# Patient Record
Sex: Female | Born: 1960 | Race: Black or African American | Hispanic: No | Marital: Married | Smoking: Never smoker
Health system: Southern US, Community
[De-identification: ages and names within clinical notes are randomized; demographics above are authoritative.]

## PROBLEM LIST (undated history)

## (undated) DIAGNOSIS — I10 Essential (primary) hypertension: Secondary | ICD-10-CM

## (undated) DIAGNOSIS — E7800 Pure hypercholesterolemia, unspecified: Secondary | ICD-10-CM

## (undated) HISTORY — PX: TUBAL LIGATION: SHX77

## (undated) HISTORY — PX: OTHER SURGICAL HISTORY: SHX169

## (undated) HISTORY — PX: SPINAL FUSION: SHX223

---

## 1998-05-25 ENCOUNTER — Inpatient Hospital Stay (HOSPITAL_COMMUNITY): Admission: AD | Admit: 1998-05-25 | Discharge: 1998-05-25 | Payer: Self-pay | Admitting: *Deleted

## 1998-05-27 ENCOUNTER — Inpatient Hospital Stay (HOSPITAL_COMMUNITY): Admission: AD | Admit: 1998-05-27 | Discharge: 1998-05-27 | Payer: Self-pay | Admitting: Obstetrics

## 1998-07-13 ENCOUNTER — Inpatient Hospital Stay (HOSPITAL_COMMUNITY): Admission: AD | Admit: 1998-07-13 | Discharge: 1998-07-13 | Payer: Self-pay | Admitting: Obstetrics & Gynecology

## 1998-09-26 ENCOUNTER — Ambulatory Visit (HOSPITAL_COMMUNITY): Admission: RE | Admit: 1998-09-26 | Discharge: 1998-09-26 | Payer: Self-pay

## 1998-10-04 ENCOUNTER — Inpatient Hospital Stay (HOSPITAL_COMMUNITY): Admission: AD | Admit: 1998-10-04 | Discharge: 1998-10-04 | Payer: Self-pay | Admitting: Obstetrics

## 1998-10-08 ENCOUNTER — Inpatient Hospital Stay (HOSPITAL_COMMUNITY): Admission: RE | Admit: 1998-10-08 | Discharge: 1998-10-08 | Payer: Self-pay | Admitting: Obstetrics & Gynecology

## 1998-10-15 ENCOUNTER — Encounter (HOSPITAL_COMMUNITY): Admission: RE | Admit: 1998-10-15 | Discharge: 1998-10-17 | Payer: Self-pay | Admitting: Obstetrics & Gynecology

## 1998-10-15 ENCOUNTER — Inpatient Hospital Stay (HOSPITAL_COMMUNITY): Admission: AD | Admit: 1998-10-15 | Discharge: 1998-10-23 | Payer: Self-pay | Admitting: *Deleted

## 1998-11-20 ENCOUNTER — Encounter: Admission: RE | Admit: 1998-11-20 | Discharge: 1998-11-20 | Payer: Self-pay | Admitting: Obstetrics & Gynecology

## 1998-11-20 ENCOUNTER — Other Ambulatory Visit: Admission: RE | Admit: 1998-11-20 | Discharge: 1998-11-20 | Payer: Self-pay | Admitting: Obstetrics & Gynecology

## 1998-12-03 ENCOUNTER — Ambulatory Visit (HOSPITAL_COMMUNITY): Admission: RE | Admit: 1998-12-03 | Discharge: 1998-12-03 | Payer: Self-pay | Admitting: Obstetrics & Gynecology

## 1998-12-28 ENCOUNTER — Encounter: Admission: RE | Admit: 1998-12-28 | Discharge: 1998-12-28 | Payer: Self-pay | Admitting: Obstetrics & Gynecology

## 2001-09-14 ENCOUNTER — Ambulatory Visit (HOSPITAL_COMMUNITY): Admission: RE | Admit: 2001-09-14 | Discharge: 2001-09-14 | Payer: Self-pay | Admitting: Obstetrics

## 2001-09-14 ENCOUNTER — Encounter: Payer: Self-pay | Admitting: Obstetrics

## 2001-10-29 ENCOUNTER — Emergency Department (HOSPITAL_COMMUNITY): Admission: EM | Admit: 2001-10-29 | Discharge: 2001-10-29 | Payer: Self-pay | Admitting: Emergency Medicine

## 2001-11-26 ENCOUNTER — Encounter: Payer: Self-pay | Admitting: Family Medicine

## 2001-11-26 ENCOUNTER — Encounter: Admission: RE | Admit: 2001-11-26 | Discharge: 2001-11-26 | Payer: Self-pay | Admitting: Family Medicine

## 2002-05-23 ENCOUNTER — Encounter: Admission: RE | Admit: 2002-05-23 | Discharge: 2002-05-23 | Payer: Self-pay | Admitting: Family Medicine

## 2002-05-23 ENCOUNTER — Encounter: Payer: Self-pay | Admitting: Family Medicine

## 2002-09-19 ENCOUNTER — Other Ambulatory Visit: Admission: RE | Admit: 2002-09-19 | Discharge: 2002-09-19 | Payer: Self-pay | Admitting: Family Medicine

## 2006-03-28 ENCOUNTER — Emergency Department (HOSPITAL_COMMUNITY): Admission: EM | Admit: 2006-03-28 | Discharge: 2006-03-28 | Payer: Self-pay | Admitting: Emergency Medicine

## 2006-11-19 ENCOUNTER — Emergency Department (HOSPITAL_COMMUNITY): Admission: EM | Admit: 2006-11-19 | Discharge: 2006-11-19 | Payer: Self-pay | Admitting: Emergency Medicine

## 2009-08-27 ENCOUNTER — Ambulatory Visit (HOSPITAL_COMMUNITY): Admission: RE | Admit: 2009-08-27 | Discharge: 2009-08-27 | Payer: Self-pay | Admitting: Family Medicine

## 2009-11-26 ENCOUNTER — Inpatient Hospital Stay (HOSPITAL_COMMUNITY): Admission: AD | Admit: 2009-11-26 | Discharge: 2009-11-26 | Payer: Self-pay | Admitting: Family Medicine

## 2009-11-26 ENCOUNTER — Emergency Department (HOSPITAL_COMMUNITY): Admission: EM | Admit: 2009-11-26 | Discharge: 2009-11-26 | Payer: Self-pay | Admitting: Emergency Medicine

## 2009-12-10 ENCOUNTER — Inpatient Hospital Stay (HOSPITAL_COMMUNITY): Admission: AD | Admit: 2009-12-10 | Discharge: 2009-12-10 | Payer: Self-pay | Admitting: Obstetrics & Gynecology

## 2009-12-12 ENCOUNTER — Other Ambulatory Visit: Admission: RE | Admit: 2009-12-12 | Discharge: 2009-12-12 | Payer: Self-pay | Admitting: Obstetrics and Gynecology

## 2009-12-12 ENCOUNTER — Ambulatory Visit: Payer: Self-pay | Admitting: Obstetrics and Gynecology

## 2009-12-13 ENCOUNTER — Encounter: Payer: Self-pay | Admitting: Family

## 2009-12-13 LAB — CONVERTED CEMR LAB
HCT: 29.2 % — ABNORMAL LOW (ref 36.0–46.0)
MCHC: 32.5 g/dL (ref 30.0–36.0)
MCV: 83 fL (ref 78.0–100.0)
Platelets: 294 10*3/uL (ref 150–400)
RBC: 3.52 M/uL — ABNORMAL LOW (ref 3.87–5.11)
WBC: 5.5 10*3/uL (ref 4.0–10.5)

## 2009-12-28 ENCOUNTER — Ambulatory Visit: Payer: Self-pay | Admitting: Obstetrics & Gynecology

## 2010-01-24 ENCOUNTER — Ambulatory Visit (HOSPITAL_COMMUNITY): Admission: RE | Admit: 2010-01-24 | Discharge: 2010-01-24 | Payer: Self-pay | Admitting: Obstetrics & Gynecology

## 2010-02-08 ENCOUNTER — Ambulatory Visit: Payer: Self-pay | Admitting: Obstetrics and Gynecology

## 2010-03-08 ENCOUNTER — Ambulatory Visit: Payer: Self-pay | Admitting: Obstetrics & Gynecology

## 2010-06-05 ENCOUNTER — Emergency Department (HOSPITAL_COMMUNITY): Admission: EM | Admit: 2010-06-05 | Discharge: 2010-06-05 | Payer: Self-pay | Admitting: Family Medicine

## 2010-06-19 ENCOUNTER — Ambulatory Visit: Payer: Self-pay | Admitting: Obstetrics and Gynecology

## 2010-06-19 LAB — CONVERTED CEMR LAB
HCT: 37.6 % (ref 36.0–46.0)
RDW: 15.7 % — ABNORMAL HIGH (ref 11.5–15.5)

## 2010-06-24 ENCOUNTER — Encounter: Admission: RE | Admit: 2010-06-24 | Discharge: 2010-06-24 | Payer: Self-pay | Admitting: Sports Medicine

## 2010-07-05 ENCOUNTER — Ambulatory Visit: Payer: Self-pay | Admitting: Obstetrics & Gynecology

## 2010-07-09 ENCOUNTER — Encounter: Admission: RE | Admit: 2010-07-09 | Discharge: 2010-07-09 | Payer: Self-pay | Admitting: Sports Medicine

## 2010-07-11 ENCOUNTER — Ambulatory Visit: Payer: Self-pay | Admitting: Nurse Practitioner

## 2010-07-11 DIAGNOSIS — E1169 Type 2 diabetes mellitus with other specified complication: Secondary | ICD-10-CM | POA: Insufficient documentation

## 2010-07-11 DIAGNOSIS — D259 Leiomyoma of uterus, unspecified: Secondary | ICD-10-CM | POA: Insufficient documentation

## 2010-07-11 DIAGNOSIS — M5137 Other intervertebral disc degeneration, lumbosacral region: Secondary | ICD-10-CM | POA: Insufficient documentation

## 2010-07-11 DIAGNOSIS — I10 Essential (primary) hypertension: Secondary | ICD-10-CM | POA: Insufficient documentation

## 2010-07-11 DIAGNOSIS — E669 Obesity, unspecified: Secondary | ICD-10-CM | POA: Insufficient documentation

## 2010-07-11 DIAGNOSIS — E7800 Pure hypercholesterolemia, unspecified: Secondary | ICD-10-CM | POA: Insufficient documentation

## 2010-07-16 ENCOUNTER — Ambulatory Visit: Payer: Self-pay | Admitting: Nurse Practitioner

## 2010-07-17 ENCOUNTER — Encounter (INDEPENDENT_AMBULATORY_CARE_PROVIDER_SITE_OTHER): Payer: Self-pay | Admitting: Nurse Practitioner

## 2010-07-17 LAB — CONVERTED CEMR LAB
Alkaline Phosphatase: 53 units/L (ref 39–117)
BUN: 15 mg/dL (ref 6–23)
Chloride: 101 meq/L (ref 96–112)
Creatinine, Ser: 0.9 mg/dL (ref 0.40–1.20)
Glucose, Bld: 73 mg/dL (ref 70–99)
Potassium: 3.9 meq/L (ref 3.5–5.3)
Total Protein: 7.7 g/dL (ref 6.0–8.3)

## 2010-07-22 ENCOUNTER — Ambulatory Visit (HOSPITAL_COMMUNITY): Admission: RE | Admit: 2010-07-22 | Discharge: 2010-07-22 | Payer: Self-pay | Admitting: Obstetrics & Gynecology

## 2010-07-22 ENCOUNTER — Inpatient Hospital Stay (HOSPITAL_COMMUNITY): Admission: AD | Admit: 2010-07-22 | Discharge: 2010-07-22 | Payer: Self-pay | Admitting: Obstetrics & Gynecology

## 2010-07-22 ENCOUNTER — Ambulatory Visit: Payer: Self-pay | Admitting: Obstetrics & Gynecology

## 2010-08-05 ENCOUNTER — Ambulatory Visit: Payer: Self-pay | Admitting: Obstetrics and Gynecology

## 2010-08-15 ENCOUNTER — Ambulatory Visit: Payer: Self-pay | Admitting: Nurse Practitioner

## 2010-08-15 ENCOUNTER — Encounter (INDEPENDENT_AMBULATORY_CARE_PROVIDER_SITE_OTHER): Payer: Self-pay | Admitting: Nurse Practitioner

## 2010-08-15 ENCOUNTER — Encounter: Payer: Self-pay | Admitting: Nurse Practitioner

## 2010-08-15 ENCOUNTER — Other Ambulatory Visit: Admission: RE | Admit: 2010-08-15 | Discharge: 2010-08-15 | Payer: Self-pay | Admitting: Nurse Practitioner

## 2010-08-15 DIAGNOSIS — N898 Other specified noninflammatory disorders of vagina: Secondary | ICD-10-CM | POA: Insufficient documentation

## 2010-08-15 LAB — CONVERTED CEMR LAB
Bilirubin Urine: NEGATIVE
Blood in Urine, dipstick: NEGATIVE
Glucose, Urine, Semiquant: NEGATIVE
KOH Prep: NEGATIVE
OCCULT 1: NEGATIVE
Urobilinogen, UA: 0.2
pH: 6.5

## 2010-08-16 ENCOUNTER — Encounter (INDEPENDENT_AMBULATORY_CARE_PROVIDER_SITE_OTHER): Payer: Self-pay | Admitting: Nurse Practitioner

## 2010-08-16 LAB — CONVERTED CEMR LAB
Basophils Absolute: 0 10*3/uL (ref 0.0–0.1)
Basophils Relative: 1 % (ref 0–1)
Cholesterol: 242 mg/dL — ABNORMAL HIGH (ref 0–200)
GC Probe Amp, Genital: NEGATIVE
Hemoglobin: 12.4 g/dL (ref 12.0–15.0)
LDL Cholesterol: 169 mg/dL — ABNORMAL HIGH (ref 0–99)
Lymphocytes Relative: 41 % (ref 12–46)
Lymphs Abs: 2.4 10*3/uL (ref 0.7–4.0)
MCHC: 33 g/dL (ref 30.0–36.0)
Neutro Abs: 2.8 10*3/uL (ref 1.7–7.7)
Neutrophils Relative %: 49 % (ref 43–77)
RDW: 16.3 % — ABNORMAL HIGH (ref 11.5–15.5)
Total CHOL/HDL Ratio: 4.2
WBC: 5.7 10*3/uL (ref 4.0–10.5)

## 2010-09-04 ENCOUNTER — Ambulatory Visit: Payer: Self-pay | Admitting: Obstetrics & Gynecology

## 2010-09-12 ENCOUNTER — Ambulatory Visit: Payer: Self-pay | Admitting: Obstetrics & Gynecology

## 2010-10-19 ENCOUNTER — Encounter: Payer: Self-pay | Admitting: Sports Medicine

## 2010-10-27 LAB — CONVERTED CEMR LAB
Cholesterol, target level: 200 mg/dL
HDL goal, serum: 40 mg/dL
Pap Smear: NEGATIVE

## 2010-10-29 NOTE — Progress Notes (Signed)
Summary: Office Visit/DEPRESSION SCREENING  Office Visit/DEPRESSION SCREENING   Imported By: Arta Bruce 08/15/2010 14:41:01  _____________________________________________________________________  External Attachment:    Type:   Image     Comment:   External Document

## 2010-10-29 NOTE — Letter (Signed)
Summary: *Referral Letter  [facility]  [address] [place], [place] [address]   Phone: [phone]  Fax: [phone]    07/16/2010  Thank you in advance for agreeing to see my patient:  [facility] [address] [place], [place]  [address]  Phone: [phone]   Patient seen today in office, blood pressure is controlled.  Patient is stable to proceed with planned procedure.   Current Medical Problems: 1)  NEED PROPHYLACTIC VACCINATION&INOCULATION FLU (ICD-V04.81) 2)  OBESITY (ICD-278.00) 3)  HYPERCHOLESTEROLEMIA (ICD-272.0) 4)  DEGENERATIVE DISC DISEASE, LUMBAR SPINE (ICD-722.52) 5)  FIBROIDS, UTERUS (ICD-218.9) 6)  HYPERTENSION, BENIGN ESSENTIAL (ICD-401.1)   Current Medications: 1)  VICODIN 5-500 MG TABS (HYDROCODONE-ACETAMINOPHEN) Rx by Dr. Farris Has 2)  FERROUS SULFATE 325 (65 FE) MG TBEC (FERROUS SULFATE) One tablet by mouth three times a day 3)  LISINOPRIL-HYDROCHLOROTHIAZIDE 10-12.5 MG TABS (LISINOPRIL-HYDROCHLOROTHIAZIDE) One tablet by mouth daily for blood pressure   Thank you again for agreeing to see our patient; please contact us if you have any further questions or need additional information.  Sincerely,  Lehman Prom, FNP

## 2010-10-29 NOTE — Assessment & Plan Note (Signed)
Summary: NEW - Establish Care   Vital Signs:  Patient profile:   50 year old female Height:      60 inches Weight:      186.3 pounds BMI:     36.52 Temp:     99.3 degrees F oral Pulse rate:   72 / minute Pulse rhythm:   regular Resp:     24 per minute BP sitting:   144 / 96  (right arm) Cuff size:   large  Vitals Entered By: Dutch Quint RN (July 11, 2010 3:08 PM)  Nutrition Counseling: Patient's BMI is greater than 25 and therefore counseled on weight management options. CC: Having fibroid surgery and needs evaluation/ release for surgery due to high BP, Hypertension Management, Lipid Management Pain Assessment Patient in pain? yes     Location: back Intensity: 7 Type: dull Onset of pain  Intermittent  Does patient need assistance? Functional Status Self care Ambulation Normal   CC:  Having fibroid surgery and needs evaluation/ release for surgery due to high BP, Hypertension Management, and Lipid Management.  History of Present Illness:  Pt into the office to establish care. Pt has been going to [facility] for the past 2-3 years.  Pt has surgery (ablation) scheduled on October 24th with preop on October 19th. Bleeding is continuous despite taking depoprovera for the past 6-9 months.  Known history of anemia and fibroids due to bleeding. 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  Hypertension History:      She complains of headache, but denies chest pain and palpitations.  Blood pressure has been elevated when going to [facility] hospital.        Positive major cardiovascular risk factors include hyperlipidemia and hypertension.  Negative major cardiovascular risk factors include female age less than 36 years old and non-tobacco-user status.    Lipid Management History:      Positive NCEP/ATP III risk factors include hypertension.  Negative NCEP/ATP III risk factors include female age less than 9  years old and non-tobacco-user status.        Comments include: previously on medications but pt has not taken in several months.      Habits & Providers  Alcohol-Tobacco-Diet     Alcohol drinks/day: 0     Tobacco Status: never  Exercise-Depression-Behavior     Does Patient Exercise: no     Drug Use: never  Past History:  Past Surgical History: Caesarean section x 2  Family History: mother - diabetes father - healthy  Social History: 4 children tobacco none ETOH - none Drug - noneSmoking Status:  never Drug Use:  never Does Patient Exercise:  no  Review of Systems General:  Complains of fatigue. CV:  Complains of chest pain or discomfort; midepigastric for the past 4 hours. Resp:  Denies cough. GI:  Denies abdominal pain, nausea, and vomiting. MS:  Complains of low back pain.  Physical Exam  General:  well-developed.  obese Head:  normocephalic.   Ears:  ear piercing(s) noted.   Lungs:  normal breath sounds.   Heart:  normal rate and regular rhythm.   Abdomen:  normal bowel sounds and no abdominal hernia.   Msk:  normal ROM.   Neurologic:  alert & oriented X3.     Impression & Recommendations:  Problem # 1:  HYPERTENSION, BENIGN ESSENTIAL (ICD-401.1) BP is elevated DASH diet EKG done - NSR Orders: EKG w/ Interpretation (93000) Her  updated medication list for this problem includes:    Lisinopril-hydrochlorothiazide 10-12.5 Mg Tabs (Lisinopril-hydrochlorothiazide) ..... One tablet by mouth daily for blood pressure  Problem # 2:  DEGENERATIVE DISC DISEASE, LUMBAR SPINE (ICD-722.52)  Pt to continue f/u with specialist currently getting injections from [facility] imaging  Problem # 3:  HYPERCHOLESTEROLEMIA (ICD-272.0)  will check lipids on next visit  Problem # 4:  FIBROIDS, UTERUS (ICD-218.9) keep appt with gyn for ablation  Problem # 5:  OBESITY (ICD-278.00) advised pt that  she is overwieght and to lose some weight would be beneficial for  several reasons  Problem # 6:  NEED PROPHYLACTIC VACCINATION&INOCULATION FLU (ICD-V04.81) given today in office  Complete Medication List: 1)  Vicodin 5-500 Mg Tabs (Hydrocodone-acetaminophen) .... Rx by dr. Farris Has 2)  Ferrous Sulfate 325 (65 Fe) Mg Tbec (Ferrous sulfate) .... One tablet by mouth three times a day 3)  Lisinopril-hydrochlorothiazide 10-12.5 Mg Tabs (Lisinopril-hydrochlorothiazide) .... One tablet by mouth daily for blood pressure  Other Orders: Flu Vaccine 6yrs + (82993) Admin 1st Vaccine (71696) Admin 1st Vaccine [facility]) [phone])  Hypertension Assessment/Plan:      The patient's hypertensive risk group is category B: At least one risk factor (excluding diabetes) with no target organ damage.  Today's blood pressure is 144/96.  Her blood pressure goal is < 140/90.  Lipid Assessment/Plan:      Based on NCEP/ATP III, the patient's risk factor category is "0-1 risk factors".  The patient's lipid goals are as follows: Total cholesterol goal is 200; LDL cholesterol goal is 160; HDL cholesterol goal is 40; Triglyceride goal is 150.    Patient Instructions: 1)  Follow up in 5 days for triage visit - blood pressure check and cmp. 2)  Goal is <130/90 3)  Take your blood pressure medications before this appointment 4)  Keep your appointment for preop and surgery as scheduled. 5)  Follow up in 4 weeks with n.martin,fnp for a complete physical exam.  no food after midnight before this visit.  Will need lipids, tsh, cbc, u/a, microalbumin, tdap, PHQ-9  6)  You have been given the flu vaccine today Prescriptions: LISINOPRIL-HYDROCHLOROTHIAZIDE 10-12.5 MG TABS (LISINOPRIL-HYDROCHLOROTHIAZIDE) One tablet by mouth daily for blood pressure  #30 x 1   Entered and Authorized by:   Lehman Prom FNP   Signed by:   Lehman Prom FNP on 07/11/2010   Method used:   Print then Give to Patient   RxID:   [id number]    EKG  Procedure date:  07/11/2010  Findings:       NSR     Influenza Vaccine    Vaccine Type: Fluvax 3+    Site: left deltoid    Mfr: GlaxoSmithKline    Dose: 0.5 ml    Route: IM    Given by: [place] Shannon    Exp. Date: 02/2011    Lot #: [id number]    VIS given: 04/23/10 version given July 11, 2010.  Flu Vaccine Consent Questions    Do you have a history of severe allergic reactions to this vaccine? no    Any prior history of allergic reactions to egg and/or gelatin? no    Do you have a sensitivity to the preservative Thimersol? no    Do you have a past history of Guillan-Barre Syndrome? no    Do you currently have an acute febrile illness? no    Have you ever had a severe reaction to latex? no    Vaccine information given and explained to  patient? yes    Are you currently pregnant? no

## 2010-10-29 NOTE — Assessment & Plan Note (Signed)
Summary: 5 DAY FU ON BP AND CMP///KT  Nurse Visit   Vital Signs:  Patient profile:   50 year old female Pulse rate:   92 / minute Pulse rhythm:   regular Resp:     20 per minute BP sitting:   108 / 82  (left arm) Cuff size:   large  Vitals Entered By: Dutch Quint RN (July 16, 2010 11:10 AM)  Patient Instructions: 1)  Reviewed with Jesse Fall 2)  Blood pressure is good 3)  Keep next scheduled appointment for follow-up 4)  Continue taking medications as ordered. 5)  You have been cleared for your surgical procedure. 6)  Call if anything changes or if you have any questions.   CC:  BP and CMP.  History of Present Illness: Took all her medications today.  Asymptomatic.  States has had some leg cramping the last couple of days, urinating a little bit more when she's laying down in bed.   Review of Systems CV:  Complains of fatigue; denies bluish discoloration of lips or nails, chest pain or discomfort, difficulty breathing at night, difficulty breathing while lying down, fainting, leg cramps with exertion, lightheadness, near fainting, palpitations, shortness of breath with exertion, swelling of feet, swelling of hands, and weight gain.   Physical Exam  Lungs:  normal respiratory effort, normal breath sounds, no crackles, and no wheezes.   Heart:  normal rate and regular rhythm.     Impression & Recommendations:  Problem # 1:  HYPERTENSION, BENIGN ESSENTIAL (ICD-401.1)  BP is good -- 108/82 She is stable -- cleared for surgical procedure; letter written CMP done Keep next scheduled appt. for f/u Continue meds as ordered  Her updated medication list for this problem includes:    Lisinopril-hydrochlorothiazide 10-12.5 Mg Tabs (Lisinopril-hydrochlorothiazide) ..... One tablet by mouth daily for blood pressure  Orders: T-Comprehensive Metabolic Panel ([address])  Complete Medication List: 1)  Vicodin 5-500 Mg Tabs (Hydrocodone-acetaminophen) .... Rx by dr.  Farris Has 2)  Ferrous Sulfate 325 (65 Fe) Mg Tbec (Ferrous sulfate) .... One tablet by mouth three times a day 3)  Lisinopril-hydrochlorothiazide 10-12.5 Mg Tabs (Lisinopril-hydrochlorothiazide) .... One tablet by mouth daily for blood pressure  CC: BP and CMP Is Patient Diabetic? No Pain Assessment      Location: legs   Orders Added: 1)  Est. Patient Level I [09811] 2)  T-Comprehensive Metabolic Panel [[id number]]

## 2010-10-29 NOTE — Letter (Signed)
Summary: TEST ORDER FORM//MAMMOGRAM//APPT DATE & TIME  TEST ORDER FORM//MAMMOGRAM//APPT DATE & TIME   Imported By: Arta Bruce 08/16/2010 15:28:01  _____________________________________________________________________  External Attachment:    Type:   Image     Comment:   External Document

## 2010-10-29 NOTE — Assessment & Plan Note (Signed)
Summary: Complete Physical Exam   Vital Signs:  Patient profile:   50 year old female LMP:     05/2010 Weight:      189.6 pounds BMI:     37.16 Temp:     99.8 degrees F oral Pulse rate:   84 / minute Pulse rhythm:   regular Resp:     20 per minute BP sitting:   100 / 68  (left arm) Cuff size:   large  Vitals Entered By: Levon Hedger (August 15, 2010 10:55 AM)  Nutrition Counseling: Patient's BMI is greater than 25 and therefore counseled on weight management options.                                                                                                                                                                                                                     CC: CPP, Hypertension Management, Lipid Management Is Patient Diabetic? No Pain Assessment Patient in pain? no       Does patient need assistance? Functional Status Self care Ambulation Normal LMP (date): 05/2010     Enter LMP: 05/2010   CC:  CPP, Hypertension Management, and Lipid Management.  History of Present Illness:  Pt into the office for a complete physical exam  PAP - last done 2 years ago. All previous normal results Menses - heavy periods up until 3 weeks ago when she had the ablation Abalation done at [facility] 3 weeks ago for fibroids 4 children married   Mammogram - last done 1 year ago no family history of breast cancer  Optho - wear glasses.  Last eye exam was 6 months and new Rx for glasses given then  Dental - last done 3 months ago.   Tdap - no tetanus in past 10 years.  Hypertension History:      She denies headache, chest pain, and palpitations.  She notes no problems with any antihypertensive medication side effects.  pt is taking medications as ordered.        Positive major cardiovascular risk factors include hyperlipidemia and hypertension.  Negative major cardiovascular risk factors include female age less than 51 years old, no history of diabetes, and  non-tobacco-user status.        Further assessment for target organ damage reveals no history of ASHD, cardiac end-organ damage (CHF/LVH), stroke/TIA, peripheral vascular disease, renal insufficiency, or hypertensive retinopathy.    Lipid Management History:      Positive NCEP/ATP III risk factors include hypertension.  Negative  NCEP/ATP III risk factors include female age less than 66 years old, no history of early menopause without estrogen hormone replacement, non-diabetic, non-tobacco-user status, no ASHD (atherosclerotic heart disease), no prior stroke/TIA, no peripheral vascular disease, and no history of aortic aneurysm.        The patient states that she knows about the "Therapeutic Lifestyle Change" diet.  Adjunctive measures started by the patient include weight reduction.  She expresses no side effects from her lipid-lowering medication.  The patient denies any symptoms to suggest myopathy or liver disease.      Habits & Providers  Alcohol-Tobacco-Diet     Alcohol drinks/day: 0     Tobacco Status: never  Exercise-Depression-Behavior     Does Patient Exercise: no     Have you felt down or hopeless? no     Have you felt little pleasure in things? no     Depression Counseling: not indicated; screening negative for depression     Drug Use: never  Comments: PHQ- 9 score = 4  Allergies (verified): No Known Drug Allergies  Review of Systems General:  Denies fever. Eyes:  Denies discharge. ENT:  Denies earache. CV:  Denies chest pain or discomfort. Resp:  Denies cough. GI:  Complains of abdominal pain and constipation; denies nausea and vomiting; due to iron, slight right lower abd tenderness following surgery. GU:  Denies discharge. MS:  Denies joint pain. Derm:  Denies dryness. Neuro:  Denies headaches. Psych:  Denies anxiety and depression.  Physical Exam  General:  alert.   Head:  normocephalic.   Eyes:  pupils equal and pupils round.   Ears:  R ear normal and L  ear normal.  bil TM with bony landmarks present Nose:  no nasal discharge.   Mouth:  fair dentition.   Neck:  supple.   Chest Wall:  no mass.   Breasts:  no masses.   Lungs:  normal breath sounds.   Heart:  normal rate and regular rhythm.   Abdomen:  normal bowel sounds.   Rectal:  no external abnormalities.   Pulses:  R radial normal and L radial normal.   Extremities:  no edema Neurologic:  alert & oriented X3.   Skin:  color normal.   Psych:  Oriented X3.    Pelvic Exam  Vulva:      normal appearance.   Urethra and Bladder:      Urethra--normal.  Bladder--normal.   Vagina:      physiologic discharge.   Cervix:      parous.   Uterus:      anteverted.   Adnexa:      nontender bilaterally.   Rectum:      normal, heme negative stool.      Impression & Recommendations:  Problem # 1:  HYPERTENSION, BENIGN ESSENTIAL (ICD-401.1) BP is stable  DASH diet continue current meds tdap given Her updated medication list for this problem includes:    Lisinopril-hydrochlorothiazide 10-12.5 Mg Tabs (Lisinopril-hydrochlorothiazide) ..... One tablet by mouth daily for blood pressure  Orders: UA Dipstick w/o Micro (manual) ([address]109) T-Urine Microalbumin w/creat. ratio [phone]) EKG w/ Interpretation (93000)  Problem # 2:  HYPERCHOLESTEROLEMIA (ICD-272.0) will check labs today Orders: T-Lipid Profile (0011001100)  Problem # 3:  OBESITY (ICD-278.00) advised pt to increase exercise however given recent procedure she has not been able to exercise for the past 3 weeks Orders: T-TSH ([id number])  Problem # 4:  OTHER SCREENING BREAST EXAMINATION (ICD-V76.19) self breast exam given mammogram scheduled  Problem #  5:  ROUTINE GYNECOLOGICAL EXAMINATION (ICD-V72.31) PAP done pt requested STD testing PHQ-9 score = 4 Orders: Hemoccult Guaiac-1 spec.(in office) (82270) Rapid HIV  (92370) KOH/ WET Mount [phone]) Pap Smear, Thin Prep ( Collection of) (U0454) Mammogram  (Screening) (Mammo)  Problem # 6:  VAGINAL DISCHARGE (ICD-623.5) will check for bacterial infection Orders: T- GC Chlamydia (09811)  Complete Medication List: 1)  Vicodin 5-500 Mg Tabs (Hydrocodone-acetaminophen) .... Rx by dr. Farris Has 2)  Ferrous Sulfate 325 (65 Fe) Mg Tbec (Ferrous sulfate) .... One tablet by mouth three times a day 3)  Lisinopril-hydrochlorothiazide 10-12.5 Mg Tabs (Lisinopril-hydrochlorothiazide) .... One tablet by mouth daily for blood pressure  Other Orders: T-CBC w/Diff ([id number]) Tdap => 68yrs IM (13086) Admin 1st Vaccine (57846)  Hypertension Assessment/Plan:      The patient's hypertensive risk group is category B: At least one risk factor (excluding diabetes) with no target organ damage.  Today's blood pressure is 100/68.  Her blood pressure goal is < 140/90.  Lipid Assessment/Plan:      Based on NCEP/ATP III, the patient's risk factor category is "0-1 risk factors".  The patient's lipid goals are as follows: Total cholesterol goal is 200; LDL cholesterol goal is 160; HDL cholesterol goal is 40; Triglyceride goal is 150.    Patient Instructions: 1)  You have been given a tetanus today.  This is good for 10 years. 2)  Constipation - most likely from taking iron pills.  Be sure to eat high fiber foods. 3)  May also take a stool softner (colace) 100mg  by mouth two times a day  4)  You will be informed of your lab results. 5)  Blood pressure - doing well. Continue medications. 6)  Keep your appointment for mammogram 7)  Follow up here in 6 months or sooner if necessary  Diabetic Foot Exam Foot Inspection Is there a history of a foot ulcer?              No Is there a foot ulcer now?              No Can the patient see the bottom of their feet?          No Are the shoes appropriate in style and fit?          Yes Is there swelling or an abnormal foot shape?          No Are the toenails long?                Yes Are the toenails thick?                 Yes Are the toenails ingrown?              No Is there heavy callous build-up?              No Is there pain in the calf muscle (Intermittent claudication) when walking?    NoIs there a claw toe deformity?              No Is there elevated skin temperature?            No Is there limited ankle dorsiflexion?            No Is there foot or ankle muscle weakness?            No  Diabetic Foot Care Education Patient educated on appropriate care of diabetic feet.  Pulse Check  Right Foot          Left Foot Dorsalis Pedis:        normal            normal  Prescriptions: LISINOPRIL-HYDROCHLOROTHIAZIDE 10-12.5 MG TABS (LISINOPRIL-HYDROCHLOROTHIAZIDE) One tablet by mouth daily for blood pressure  #30 x 5   Entered and Authorized by:   Lehman Prom FNP   Signed by:   Lehman Prom FNP on 08/15/2010   Method used:   Print then Give to Patient   RxID:   [id number]    Orders Added: 1)  Est. Patient age 41-64 [99396] 2)  Hemoccult Guaiac-1 spec.(in office) [82270] 3)  UA Dipstick w/o Micro (manual) [81002] 4)  T-Urine Microalbumin w/creat. ratio [82043-[address]] 5)  T-Lipid Profile [80061-22930] 6)  T-CBC w/Diff [84132-44010] 7)  Rapid HIV  [92370] 8)  T-TSH [27253-66440] 9)  KOH/ WET Mount [87210] 10)  Pap Smear, Thin Prep ( Collection of) [Q0091] 11)  EKG w/ Interpretation [93000] 12)  Mammogram (Screening) [Mammo] 13)  T- GC Chlamydia [34742] 14)  Tdap => 16yrs IM [90715] 15)  Admin 1st Vaccine [59563]   Immunizations Administered:  Tetanus Vaccine:    Vaccine Type: Tdap    Site: left deltoid    Mfr: Sanofi Pasteur    Dose: 0.5 ml    Route: IM    Given by: Levon Hedger    Exp. Date: 12/26/2012    Lot #: [id number]    VIS given: 08/16/08 version given August 15, 2010.   Immunizations Administered:  Tetanus Vaccine:    Vaccine Type: Tdap    Site: left deltoid    Mfr: Sanofi Pasteur    Dose: 0.5 ml    Route: IM    Given by: Levon Hedger    Exp.  Date: 12/26/2012    Lot #: [id number]    VIS given: 08/16/08 version given August 15, 2010.   EKG  Procedure date:  08/08/2010  Findings:      sinus rhythm with PVC's   Laboratory Results   Urine Tests  Date/Time Received: August 15, 2010 12:10 PM   Routine Urinalysis   Color: hazy Glucose: negative   (Normal Range: Negative) Bilirubin: negative   (Normal Range: Negative) Ketone: negative   (Normal Range: Negative) Spec. Gravity: 1.015   (Normal Range: 1.003-1.035) Blood: negative   (Normal Range: Negative) pH: 6.5   (Normal Range: 5.0-8.0) Protein: negative   (Normal Range: Negative) Urobilinogen: 0.2   (Normal Range: 0-1) Nitrite: negative   (Normal Range: Negative) Leukocyte Esterace: negative   (Normal Range: Negative)    Date/Time Received: August 15, 2010 3:23 PM   Wet Mount Source: vaginal WBC/hpf: 1-5 Bacteria/hpf: rare Clue cells/hpf: none Yeast/hpf: none Wet Mount KOH: Negative Trichomonas/hpf: none  Other Tests  Rapid HIV: negative  Stool - Occult Blood Hemmoccult #1: negative Date: 08/15/2010     Prevention & Chronic Care Immunizations   Influenza vaccine: Fluvax 3+  (07/11/2010)    Tetanus booster: 08/15/2010: Tdap    Pneumococcal vaccine: Not documented  Other Screening   Pap smear: Not documented    Mammogram: Not documented   Smoking status: never  (08/15/2010)  Lipids   Total Cholesterol: Not documented   LDL: Not documented   LDL Direct: Not documented   HDL: Not documented   Triglycerides: Not documented    SGOT (AST): 13  (07/17/2010)   SGPT (ALT): 13  (07/17/2010)   Alkaline phosphatase: 53  (07/17/2010)   Total bilirubin: 0.5  (07/17/2010)  Hypertension   Last Blood Pressure: 100 / 68  (08/15/2010)   Serum creatinine: 0.90  (07/17/2010)   Serum potassium 3.9  (07/17/2010)  Self-Management Support :    Hypertension self-management support: Not documented    Lipid self-management support: Not  documented

## 2010-10-29 NOTE — Letter (Signed)
Summary: *HSN Results Follow up  [facility]  [address] [place], [place] [address]   Phone: [phone]  Fax: [phone]      07/17/2010   Mckala D SCUDDER-FRANCIS [address] [place], [place]  [address]   Dear  Ms. Kealohilani SCUDDER-FRANCIS,                            ____S.Drinkard,FNP   ____D. Gore,FNP       ____B. McPherson,MD   ____V. Rankins,MD    ____E. Mulberry,MD    _X___N. Daphine Deutscher, FNP  ____D. Reche Dixon, MD    ____K. Philipp Deputy, MD    ____Other     This letter is to inform you that your recent test(s):  _______Pap Smear    ___X____Lab Test     _______X-ray    ___X____ is within acceptable limits  _______ requires a medication change  _______ requires a follow-up lab visit  _______ requires a follow-up visit with your Maryse Brierley   Comments: Labs done during recent office visit are normal.       _________________________________________________________ If you have any questions, please contact our office [phone].                    Sincerely,    Lehman Prom FNP [facility]

## 2010-10-29 NOTE — Letter (Signed)
Summary: Lipid Letter  [facility]  [address] [place], [place] [address]   Phone: [phone]  Fax: [phone]    08/16/2010  [facility] [address] [place], [place]  [address]  Dear Donnal Debar:  We have carefully reviewed your last lipid profile from 08/15/2010 and the results are noted below with a summary of recommendations for lipid management.    Cholesterol:       242     Goal: less than 200   HDL "good" Cholesterol:   58     Goal: greater than 40   LDL "bad" Cholesterol:   169     Goal: less than 100   Triglycerides:       73     Goal: less than 150    Your cholesterol is elevated.  You should have been notified by this office on the need to start cholesterol medications.  You will need recheck cholesterol and liver labs in 6-8 weeks after starting medications.  Pap Smear results _______________________.     Current Medications: 1)    Vicodin 5-500 Mg Tabs (Hydrocodone-acetaminophen) .... Rx by dr. Farris Has 2)    Ferrous Sulfate 325 (65 Fe) Mg Tbec (Ferrous sulfate) .... One tablet by mouth three times a day 3)    Lisinopril-hydrochlorothiazide 10-12.5 Mg Tabs (Lisinopril-hydrochlorothiazide) .... One tablet by mouth daily for blood pressure 4)    Pravastatin Sodium 20 Mg Tabs (Pravastatin sodium) .... One tablet by mouth nightly for cholesterol  If you have any questions, please call. We appreciate being able to work with you.   Sincerely,    [facility] Lehman Prom FNP

## 2010-11-28 ENCOUNTER — Other Ambulatory Visit: Payer: Self-pay | Admitting: Sports Medicine

## 2010-11-28 DIAGNOSIS — M549 Dorsalgia, unspecified: Secondary | ICD-10-CM

## 2010-12-02 ENCOUNTER — Ambulatory Visit
Admission: RE | Admit: 2010-12-02 | Discharge: 2010-12-02 | Disposition: A | Discharge status: Home / Self Care | Payer: No Typology Code available for payment source | Source: Ambulatory Visit | Attending: Sports Medicine | Admitting: Sports Medicine

## 2010-12-02 DIAGNOSIS — M549 Dorsalgia, unspecified: Secondary | ICD-10-CM

## 2010-12-11 LAB — BASIC METABOLIC PANEL
CO2: 27 mEq/L (ref 19–32)
Calcium: 9.2 mg/dL (ref 8.4–10.5)
Chloride: 101 mEq/L (ref 96–112)
Creatinine, Ser: 0.88 mg/dL (ref 0.4–1.2)
GFR calc non Af Amer: >60 mL/min (ref >60)
Potassium: 3.5 mEq/L (ref 3.5–5.1)
Sodium: 135 mEq/L (ref 135–145)

## 2010-12-11 LAB — CBC
HCT: 39.7 % (ref 36.0–46.0)
MCH: 29.4 pg (ref 26.0–34.0)
MCHC: 33.2 g/dL (ref 30.0–36.0)
MCV: 88.6 fL (ref 78.0–100.0)
RBC: 4.48 MIL/uL (ref 3.87–5.11)

## 2010-12-11 LAB — PREGNANCY, URINE: Preg Test, Ur: NEGATIVE

## 2010-12-12 LAB — POCT PREGNANCY, URINE: Preg Test, Ur: NEGATIVE

## 2010-12-18 LAB — POCT I-STAT, CHEM 8
BUN: 4 mg/dL — ABNORMAL LOW (ref 6–23)
Calcium, Ion: 1.14 mmol/L (ref 1.12–1.32)
Creatinine, Ser: 0.7 mg/dL (ref 0.4–1.2)
Glucose, Bld: 98 mg/dL (ref 70–99)
TCO2: 28 mmol/L (ref 0–100)

## 2010-12-18 LAB — TYPE AND SCREEN

## 2010-12-18 LAB — URINALYSIS, ROUTINE W REFLEX MICROSCOPIC
Leukocytes, UA: NEGATIVE
Specific Gravity, Urine: 1.008 (ref 1.005–1.030)
Urobilinogen, UA: 0.2 mg/dL (ref 0.0–1.0)
pH: 7.5 (ref 5.0–8.0)

## 2010-12-18 LAB — ABO/RH: ABO/RH(D): O NEG

## 2010-12-18 LAB — WET PREP, GENITAL
Clue Cells Wet Prep HPF POC: NONE SEEN
Trich, Wet Prep: NONE SEEN
WBC, Wet Prep HPF POC: NONE SEEN
Yeast Wet Prep HPF POC: NONE SEEN

## 2010-12-18 LAB — GC/CHLAMYDIA PROBE AMP, GENITAL: GC Probe Amp, Genital: NEGATIVE

## 2010-12-19 LAB — CBC
HCT: 31.7 % — ABNORMAL LOW (ref 36.0–46.0)
Hemoglobin: 10.7 g/dL — ABNORMAL LOW (ref 12.0–15.0)
MCHC: 33.7 g/dL (ref 30.0–36.0)
MCV: 85.5 fL (ref 78.0–100.0)
RBC: 3.71 MIL/uL — ABNORMAL LOW (ref 3.87–5.11)
RDW: 18 % — ABNORMAL HIGH (ref 11.5–15.5)

## 2010-12-23 LAB — POCT PREGNANCY, URINE: Preg Test, Ur: NEGATIVE

## 2010-12-23 LAB — CBC
Hemoglobin: 10.1 g/dL — ABNORMAL LOW (ref 12.0–15.0)
MCHC: 32.7 g/dL (ref 30.0–36.0)
RBC: 3.58 MIL/uL — ABNORMAL LOW (ref 3.87–5.11)
RDW: 18.3 % — ABNORMAL HIGH (ref 11.5–15.5)
WBC: 3.9 10*3/uL — ABNORMAL LOW (ref 4.0–10.5)

## 2011-01-31 ENCOUNTER — Emergency Department (HOSPITAL_COMMUNITY): Payer: No Typology Code available for payment source

## 2011-01-31 ENCOUNTER — Emergency Department (HOSPITAL_COMMUNITY)
Admission: EM | Admit: 2011-01-31 | Discharge: 2011-01-31 | Disposition: A | Discharge status: Home / Self Care | Payer: No Typology Code available for payment source | Attending: Emergency Medicine | Admitting: Emergency Medicine

## 2011-01-31 DIAGNOSIS — S40019A Contusion of unspecified shoulder, initial encounter: Secondary | ICD-10-CM | POA: Insufficient documentation

## 2011-01-31 DIAGNOSIS — E7800 Pure hypercholesterolemia, unspecified: Secondary | ICD-10-CM | POA: Insufficient documentation

## 2011-01-31 DIAGNOSIS — M542 Cervicalgia: Secondary | ICD-10-CM | POA: Insufficient documentation

## 2011-01-31 DIAGNOSIS — I517 Cardiomegaly: Secondary | ICD-10-CM | POA: Insufficient documentation

## 2011-01-31 DIAGNOSIS — S335XXA Sprain of ligaments of lumbar spine, initial encounter: Secondary | ICD-10-CM | POA: Insufficient documentation

## 2011-01-31 DIAGNOSIS — J984 Other disorders of lung: Secondary | ICD-10-CM | POA: Insufficient documentation

## 2011-01-31 DIAGNOSIS — S20219A Contusion of unspecified front wall of thorax, initial encounter: Secondary | ICD-10-CM | POA: Insufficient documentation

## 2011-01-31 DIAGNOSIS — I10 Essential (primary) hypertension: Secondary | ICD-10-CM | POA: Insufficient documentation

## 2011-01-31 DIAGNOSIS — S139XXA Sprain of joints and ligaments of unspecified parts of neck, initial encounter: Secondary | ICD-10-CM | POA: Insufficient documentation

## 2011-01-31 LAB — POCT I-STAT, CHEM 8
Creatinine, Ser: 1 mg/dL (ref 0.4–1.2)
Hemoglobin: 12.6 g/dL (ref 12.0–15.0)
Potassium: 3.5 mEq/L (ref 3.5–5.1)
Sodium: 140 mEq/L (ref 135–145)

## 2011-02-05 ENCOUNTER — Ambulatory Visit: Payer: No Typology Code available for payment source | Attending: Sports Medicine | Admitting: Rehabilitation

## 2011-02-05 DIAGNOSIS — IMO0001 Reserved for inherently not codable concepts without codable children: Secondary | ICD-10-CM | POA: Insufficient documentation

## 2011-02-05 DIAGNOSIS — M542 Cervicalgia: Secondary | ICD-10-CM | POA: Insufficient documentation

## 2011-02-05 DIAGNOSIS — M545 Low back pain, unspecified: Secondary | ICD-10-CM | POA: Insufficient documentation

## 2011-02-05 DIAGNOSIS — R262 Difficulty in walking, not elsewhere classified: Secondary | ICD-10-CM | POA: Insufficient documentation

## 2011-02-11 ENCOUNTER — Ambulatory Visit: Payer: No Typology Code available for payment source | Admitting: Physical Therapy

## 2011-02-13 ENCOUNTER — Ambulatory Visit: Payer: No Typology Code available for payment source | Admitting: Physical Therapy

## 2011-02-18 ENCOUNTER — Ambulatory Visit: Payer: No Typology Code available for payment source | Admitting: Physical Therapy

## 2011-02-20 ENCOUNTER — Ambulatory Visit: Payer: No Typology Code available for payment source | Admitting: Physical Therapy

## 2011-02-26 ENCOUNTER — Ambulatory Visit: Payer: No Typology Code available for payment source | Admitting: Physical Therapy

## 2011-02-27 ENCOUNTER — Ambulatory Visit: Payer: No Typology Code available for payment source | Admitting: Physical Therapy

## 2011-03-04 ENCOUNTER — Ambulatory Visit: Payer: No Typology Code available for payment source | Attending: Sports Medicine | Admitting: Physical Therapy

## 2011-03-04 DIAGNOSIS — M542 Cervicalgia: Secondary | ICD-10-CM | POA: Insufficient documentation

## 2011-03-04 DIAGNOSIS — M545 Low back pain, unspecified: Secondary | ICD-10-CM | POA: Insufficient documentation

## 2011-03-04 DIAGNOSIS — IMO0001 Reserved for inherently not codable concepts without codable children: Secondary | ICD-10-CM | POA: Insufficient documentation

## 2011-03-04 DIAGNOSIS — R262 Difficulty in walking, not elsewhere classified: Secondary | ICD-10-CM | POA: Insufficient documentation

## 2011-03-06 ENCOUNTER — Ambulatory Visit: Payer: No Typology Code available for payment source | Admitting: Physical Therapy

## 2011-03-11 ENCOUNTER — Ambulatory Visit: Payer: No Typology Code available for payment source | Admitting: Physical Therapy

## 2011-03-13 ENCOUNTER — Encounter: Payer: No Typology Code available for payment source | Admitting: Physical Therapy

## 2011-03-14 ENCOUNTER — Ambulatory Visit: Payer: No Typology Code available for payment source | Admitting: Physical Therapy

## 2011-03-19 ENCOUNTER — Ambulatory Visit: Payer: No Typology Code available for payment source | Admitting: Physical Therapy

## 2011-03-21 ENCOUNTER — Ambulatory Visit: Payer: No Typology Code available for payment source | Admitting: Physical Therapy

## 2011-03-26 ENCOUNTER — Ambulatory Visit: Payer: No Typology Code available for payment source | Admitting: Physical Therapy

## 2011-03-27 ENCOUNTER — Inpatient Hospital Stay (INDEPENDENT_AMBULATORY_CARE_PROVIDER_SITE_OTHER)
Admission: RE | Admit: 2011-03-27 | Discharge: 2011-03-27 | Disposition: A | Discharge status: Home / Self Care | Payer: No Typology Code available for payment source | Source: Ambulatory Visit | Attending: Family Medicine | Admitting: Family Medicine

## 2011-03-27 DIAGNOSIS — K219 Gastro-esophageal reflux disease without esophagitis: Secondary | ICD-10-CM

## 2011-03-28 ENCOUNTER — Ambulatory Visit: Payer: No Typology Code available for payment source | Admitting: Physical Therapy

## 2011-04-01 ENCOUNTER — Ambulatory Visit: Payer: No Typology Code available for payment source | Attending: Sports Medicine | Admitting: Physical Therapy

## 2011-04-01 DIAGNOSIS — M542 Cervicalgia: Secondary | ICD-10-CM | POA: Insufficient documentation

## 2011-04-01 DIAGNOSIS — M545 Low back pain, unspecified: Secondary | ICD-10-CM | POA: Insufficient documentation

## 2011-04-01 DIAGNOSIS — R262 Difficulty in walking, not elsewhere classified: Secondary | ICD-10-CM | POA: Insufficient documentation

## 2011-04-01 DIAGNOSIS — IMO0001 Reserved for inherently not codable concepts without codable children: Secondary | ICD-10-CM | POA: Insufficient documentation

## 2011-04-03 ENCOUNTER — Other Ambulatory Visit (HOSPITAL_COMMUNITY): Payer: Self-pay | Admitting: Sports Medicine

## 2011-04-03 DIAGNOSIS — M545 Low back pain, unspecified: Secondary | ICD-10-CM

## 2011-04-03 DIAGNOSIS — M5432 Sciatica, left side: Secondary | ICD-10-CM

## 2011-04-03 DIAGNOSIS — M79605 Pain in left leg: Secondary | ICD-10-CM

## 2011-04-07 ENCOUNTER — Ambulatory Visit: Payer: No Typology Code available for payment source | Admitting: Physical Therapy

## 2011-04-08 ENCOUNTER — Ambulatory Visit (HOSPITAL_COMMUNITY)
Admission: RE | Admit: 2011-04-08 | Discharge: 2011-04-08 | Disposition: A | Discharge status: Home / Self Care | Payer: No Typology Code available for payment source | Source: Ambulatory Visit | Attending: Sports Medicine | Admitting: Sports Medicine

## 2011-04-08 DIAGNOSIS — M545 Low back pain, unspecified: Secondary | ICD-10-CM | POA: Insufficient documentation

## 2011-04-08 DIAGNOSIS — M79609 Pain in unspecified limb: Secondary | ICD-10-CM | POA: Insufficient documentation

## 2011-04-08 DIAGNOSIS — M5124 Other intervertebral disc displacement, thoracic region: Secondary | ICD-10-CM | POA: Insufficient documentation

## 2011-04-08 DIAGNOSIS — M5126 Other intervertebral disc displacement, lumbar region: Secondary | ICD-10-CM | POA: Insufficient documentation

## 2011-04-08 DIAGNOSIS — M47817 Spondylosis without myelopathy or radiculopathy, lumbosacral region: Secondary | ICD-10-CM | POA: Insufficient documentation

## 2011-04-08 DIAGNOSIS — M79605 Pain in left leg: Secondary | ICD-10-CM

## 2011-04-08 DIAGNOSIS — M5432 Sciatica, left side: Secondary | ICD-10-CM

## 2011-04-08 DIAGNOSIS — D259 Leiomyoma of uterus, unspecified: Secondary | ICD-10-CM | POA: Insufficient documentation

## 2011-04-08 DIAGNOSIS — M5137 Other intervertebral disc degeneration, lumbosacral region: Secondary | ICD-10-CM | POA: Insufficient documentation

## 2011-04-08 DIAGNOSIS — M51379 Other intervertebral disc degeneration, lumbosacral region without mention of lumbar back pain or lower extremity pain: Secondary | ICD-10-CM | POA: Insufficient documentation

## 2011-04-08 DIAGNOSIS — M519 Unspecified thoracic, thoracolumbar and lumbosacral intervertebral disc disorder: Secondary | ICD-10-CM | POA: Insufficient documentation

## 2011-04-09 ENCOUNTER — Ambulatory Visit: Payer: No Typology Code available for payment source | Admitting: Physical Therapy

## 2011-04-15 ENCOUNTER — Ambulatory Visit: Payer: No Typology Code available for payment source | Admitting: Physical Therapy

## 2011-04-18 ENCOUNTER — Encounter: Payer: No Typology Code available for payment source | Admitting: Physical Therapy

## 2011-04-21 ENCOUNTER — Emergency Department (HOSPITAL_COMMUNITY): Payer: No Typology Code available for payment source

## 2011-04-21 ENCOUNTER — Observation Stay (HOSPITAL_COMMUNITY)
Admission: EM | Admit: 2011-04-21 | Discharge: 2011-04-22 | Disposition: A | Discharge status: Home / Self Care | Payer: No Typology Code available for payment source | Source: Ambulatory Visit | Attending: Internal Medicine | Admitting: Internal Medicine

## 2011-04-21 ENCOUNTER — Encounter (HOSPITAL_COMMUNITY): Payer: Self-pay | Admitting: Radiology

## 2011-04-21 ENCOUNTER — Encounter: Payer: Self-pay | Admitting: Internal Medicine

## 2011-04-21 DIAGNOSIS — R110 Nausea: Secondary | ICD-10-CM | POA: Insufficient documentation

## 2011-04-21 DIAGNOSIS — E7800 Pure hypercholesterolemia, unspecified: Secondary | ICD-10-CM | POA: Insufficient documentation

## 2011-04-21 DIAGNOSIS — R079 Chest pain, unspecified: Secondary | ICD-10-CM

## 2011-04-21 DIAGNOSIS — R0602 Shortness of breath: Secondary | ICD-10-CM | POA: Insufficient documentation

## 2011-04-21 DIAGNOSIS — Z01812 Encounter for preprocedural laboratory examination: Secondary | ICD-10-CM | POA: Insufficient documentation

## 2011-04-21 DIAGNOSIS — M545 Low back pain, unspecified: Secondary | ICD-10-CM | POA: Insufficient documentation

## 2011-04-21 DIAGNOSIS — G8929 Other chronic pain: Secondary | ICD-10-CM | POA: Insufficient documentation

## 2011-04-21 HISTORY — DX: Essential (primary) hypertension: I10

## 2011-04-21 HISTORY — DX: Pure hypercholesterolemia, unspecified: E78.00

## 2011-04-21 LAB — BASIC METABOLIC PANEL
Chloride: 104 mEq/L (ref 96–112)
GFR calc Af Amer: >60 mL/min (ref >60)
Potassium: 3.4 mEq/L — ABNORMAL LOW (ref 3.5–5.1)
Sodium: 139 mEq/L (ref 135–145)

## 2011-04-21 LAB — HEPATIC FUNCTION PANEL
ALT: 15 U/L (ref 0–35)
AST: 16 U/L (ref 0–37)
Albumin: 3.5 g/dL (ref 3.5–5.2)
Alkaline Phosphatase: 64 U/L (ref 39–117)
Bilirubin, Direct: <0.1 mg/dL (ref 0.0–0.3)
Bilirubin, Direct: 0.1 mg/dL (ref 0.0–0.3)
Indirect Bilirubin: 0.2 mg/dL — ABNORMAL LOW (ref 0.3–0.9)
Total Bilirubin: 0.2 mg/dL — ABNORMAL LOW (ref 0.3–1.2)
Total Bilirubin: 0.3 mg/dL (ref 0.3–1.2)

## 2011-04-21 LAB — POCT I-STAT, CHEM 8
Chloride: 102 mEq/L (ref 96–112)
Glucose, Bld: 117 mg/dL — ABNORMAL HIGH (ref 70–99)
HCT: 37 % (ref 36.0–46.0)
Potassium: 3.5 mEq/L (ref 3.5–5.1)

## 2011-04-21 LAB — URINALYSIS, ROUTINE W REFLEX MICROSCOPIC
Bilirubin Urine: NEGATIVE
Ketones, ur: NEGATIVE mg/dL
Leukocytes, UA: NEGATIVE
Nitrite: NEGATIVE
Protein, ur: NEGATIVE mg/dL

## 2011-04-21 LAB — DIFFERENTIAL
Eosinophils Relative: 2 % (ref 0–5)
Lymphocytes Relative: 50 % — ABNORMAL HIGH (ref 12–46)
Lymphs Abs: 2.3 10*3/uL (ref 0.7–4.0)
Neutrophils Relative %: 37 % — ABNORMAL LOW (ref 43–77)

## 2011-04-21 LAB — CBC
HCT: 34 % — ABNORMAL LOW (ref 36.0–46.0)
Hemoglobin: 11.6 g/dL — ABNORMAL LOW (ref 12.0–15.0)
MCV: 81 fL (ref 78.0–100.0)
RBC: 4.2 MIL/uL (ref 3.87–5.11)
WBC: 4.6 10*3/uL (ref 4.0–10.5)

## 2011-04-21 LAB — CARDIAC PANEL(CRET KIN+CKTOT+MB+TROPI)
CK, MB: 1.9 ng/mL (ref 0.3–4.0)
Relative Index: 1 (ref 0.0–2.5)
Total CK: 183 U/L — ABNORMAL HIGH (ref 7–177)
Troponin I: <0.3 ng/mL (ref <0.30)

## 2011-04-21 LAB — D-DIMER, QUANTITATIVE: D-Dimer, Quant: 0.53 ug/mL-FEU — ABNORMAL HIGH (ref 0.00–0.48)

## 2011-04-21 LAB — PROTIME-INR: Prothrombin Time: 13.1 seconds (ref 11.6–15.2)

## 2011-04-21 LAB — CK TOTAL AND CKMB (NOT AT ARMC): Relative Index: 1.1 (ref 0.0–2.5)

## 2011-04-21 LAB — MAGNESIUM: Magnesium: 2 mg/dL (ref 1.5–2.5)

## 2011-04-21 LAB — TSH: TSH: 0.609 u[IU]/mL (ref 0.350–4.500)

## 2011-04-21 LAB — TROPONIN I: Troponin I: <0.3 ng/mL (ref <0.30)

## 2011-04-21 MED ORDER — IOHEXOL 300 MG/ML  SOLN
80.0000 mL | Freq: Once | INTRAMUSCULAR | Status: AC | PRN
  Administered 2011-04-21: 80 mL via INTRAVENOUS
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
## 2011-04-22 LAB — BASIC METABOLIC PANEL
BUN: 6 mg/dL (ref 6–23)
CO2: 28 mEq/L (ref 19–32)
Calcium: 8.8 mg/dL (ref 8.4–10.5)
Creatinine, Ser: 0.67 mg/dL (ref 0.50–1.10)
GFR calc Af Amer: >60 mL/min (ref >60)
GFR calc non Af Amer: >60 mL/min (ref >60)
Glucose, Bld: 97 mg/dL (ref 70–99)

## 2011-04-22 LAB — RAPID URINE DRUG SCREEN, HOSP PERFORMED
Barbiturates: NOT DETECTED
Cocaine: NOT DETECTED
Tetrahydrocannabinol: NOT DETECTED

## 2011-04-22 LAB — H. PYLORI ANTIBODY, IGG: H Pylori IgG: >8 {ISR} — ABNORMAL HIGH

## 2011-04-22 LAB — CBC
MCH: 27.7 pg (ref 26.0–34.0)
MCHC: 34.2 g/dL (ref 30.0–36.0)
Platelets: 224 10*3/uL (ref 150–400)
RDW: 15.1 % (ref 11.5–15.5)

## 2011-04-22 LAB — CARDIAC PANEL(CRET KIN+CKTOT+MB+TROPI)
CK, MB: 1.9 ng/mL (ref 0.3–4.0)
Relative Index: 1.2 (ref 0.0–2.5)
Total CK: 158 U/L (ref 7–177)
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
## 2011-04-23 NOTE — Discharge Summary (Addendum)
Admitted for chest pain of non-cardiac cause. All emergent causes of chest pain ruled out. Most likely MSK pain given reproducibility with palpation. Will need follow up for 1) GERD/positive H pylori, 2) SSRI tx for chronic pain/depression, 3) outpatient sleep study.    PT also had incidental finding of 6 mm ground-glass nodular density in the left apex. If the patient is at high risk for bronchogenic carcinoma, follow-up chest CT at 6- 12 months is recommended.  If the patient is at low risk for bronchogenic carcinoma, follow-up chest CT at 12 months is brecommended.  Given Jill Shah' family history of lung cancer she should have follow up in 6 months.

## 2011-06-27 ENCOUNTER — Other Ambulatory Visit (HOSPITAL_COMMUNITY): Payer: PRIVATE HEALTH INSURANCE

## 2011-07-11 ENCOUNTER — Encounter (HOSPITAL_COMMUNITY)
Admission: RE | Admit: 2011-07-11 | Discharge: 2011-07-11 | Disposition: A | Discharge status: Home / Self Care | Payer: PRIVATE HEALTH INSURANCE | Source: Ambulatory Visit | Attending: Neurological Surgery | Admitting: Neurological Surgery

## 2011-07-11 LAB — TYPE AND SCREEN: ABO/RH(D): O NEG

## 2011-07-11 LAB — CBC
Hemoglobin: 11.8 g/dL — ABNORMAL LOW (ref 12.0–15.0)
MCHC: 34 g/dL (ref 30.0–36.0)
WBC: 5.4 10*3/uL (ref 4.0–10.5)

## 2011-07-11 LAB — BASIC METABOLIC PANEL
BUN: 9 mg/dL (ref 6–23)
Creatinine, Ser: 0.75 mg/dL (ref 0.50–1.10)
GFR calc Af Amer: >90 mL/min (ref >90)
GFR calc non Af Amer: >90 mL/min (ref >90)
Potassium: 4.4 mEq/L (ref 3.5–5.1)

## 2011-07-11 LAB — PROTIME-INR
INR: 0.97 (ref 0.00–1.49)
Prothrombin Time: 13.1 seconds (ref 11.6–15.2)

## 2011-07-11 LAB — DIFFERENTIAL
Basophils Absolute: 0 10*3/uL (ref 0.0–0.1)
Basophils Relative: 0 % (ref 0–1)
Monocytes Absolute: 0.4 10*3/uL (ref 0.1–1.0)
Neutro Abs: 2.2 10*3/uL (ref 1.7–7.7)
Neutrophils Relative %: 41 % — ABNORMAL LOW (ref 43–77)

## 2011-07-14 ENCOUNTER — Ambulatory Visit (HOSPITAL_BASED_OUTPATIENT_CLINIC_OR_DEPARTMENT_OTHER): Payer: PRIVATE HEALTH INSURANCE | Attending: Family Medicine

## 2011-07-14 DIAGNOSIS — G4710 Hypersomnia, unspecified: Secondary | ICD-10-CM | POA: Insufficient documentation

## 2011-07-14 DIAGNOSIS — Z01812 Encounter for preprocedural laboratory examination: Secondary | ICD-10-CM | POA: Insufficient documentation

## 2011-07-16 ENCOUNTER — Other Ambulatory Visit (HOSPITAL_COMMUNITY): Payer: PRIVATE HEALTH INSURANCE

## 2011-07-19 DIAGNOSIS — G4730 Sleep apnea, unspecified: Secondary | ICD-10-CM

## 2011-07-19 DIAGNOSIS — G4710 Hypersomnia, unspecified: Secondary | ICD-10-CM

## 2011-07-19 NOTE — Procedures (Signed)
NAME:  Jill Shah, Jill Shah       ACCOUNT NO.:  [id number]  MEDICAL RECORD NO.:  [id number]          PATIENT TYPE:  OUT  LOCATION:  SLEEP CENTER                 FACILITY[facility]  [facility]  PHYSICIAN:  Uma Jerde D. Maple Hudson, MD, FCCP, FACPDATE OF BIRTH:  09[birth year]  DATE OF STUDY:  07/14/2011                           NOCTURNAL POLYSOMNOGRAM  REFERRING PHYSICIAN:  Georganna Skeans, MD  REFERRING PHYSICIAN:  Georganna Skeans, MD  INDICATION FOR STUDY:  Hypersomnia with sleep apnea.  EPWORTH SLEEPINESS SCORE:  11/24.  BMI 37.1, weight 190 pounds, height 60 inches, neck 13.5 inches.  HOME MEDICATIONS:  Charted and reviewed.  SLEEP ARCHITECTURE:  Total sleep time 274 minutes with sleep efficiency 61.2%.  Stage I was 8.6%, stage II 64.6%, stage III 7.7%, REM 19.2% of total sleep time.  Sleep latency 26.5 minutes, REM latency 198 minutes, awake after sleep onset 148 minutes.  Arousal index 12.5.  BEDTIME MEDICATION:  None.  Sleep was marked by frequent and sustained intervals of wakefulness. She felt restricted by the wires and uncomfortable lying on her back.  RESPIRATORY DATA:  Apnea-hypopnea index (AHI) 1.8 per hour.  A total of 8 events was scored, all as hypopneas associated with supine sleep and REM.  REM AHI 8 per hour.  There were insufficient numbers of events to permit application of CPAP titration by split protocol on the study night.  OXYGEN DATA:  Moderately loud snoring with oxygen desaturation to a nadir of 81% and a mean oxygen saturation through the study of 93.8% on room air.  CARDIAC DATA:  Normal sinus rhythm.  MOVEMENT-PARASOMNIA:  No significant movement disturbance bathroom x2.  IMPRESSION-RECOMMENDATION: 1. Sleep architecture was significant for frequent intervals of     sustained wakefulness throughout the night.  She reported some     discomfort related to the monitoring leads and was uncomfortable     lying on her back.  If sleep comfort and wakefulness during the  night are characteristic of the home environment, she may respond     best to treatment for insomnia. 2. Insignificant sleep-disordered breathing.  Within normal limits.     Apnea/hypopnea index 1.8 per hour (normal range for adults is     between 0 and 5 per hour).  Moderately loud snoring with oxygen     desaturation to a nadir of 81% and a mean saturation through the     study of 93.8% on room air.  A total of 0.6 minutes was recorded     during the total recording time, with saturation less than 88% on     room air.     Bayne Fosnaugh D. Maple Hudson, MD, [facility], FACP Diplomate, Biomedical engineer of Sleep Medicine Electronically Signed    CDY/MEDQ  D:  07/19/2011 09:03:00  T:  07/19/2011 09:28:05  Job:  [id number]

## 2011-07-23 ENCOUNTER — Inpatient Hospital Stay (HOSPITAL_COMMUNITY): Payer: PRIVATE HEALTH INSURANCE

## 2011-07-23 ENCOUNTER — Inpatient Hospital Stay (HOSPITAL_COMMUNITY)
Admission: RE | Admit: 2011-07-23 | Discharge: 2011-07-26 | DRG: 455 | Disposition: A | Discharge status: Home / Self Care | Payer: PRIVATE HEALTH INSURANCE | Source: Ambulatory Visit | Attending: Neurological Surgery | Admitting: Neurological Surgery

## 2011-07-23 DIAGNOSIS — M48061 Spinal stenosis, lumbar region without neurogenic claudication: Secondary | ICD-10-CM | POA: Diagnosis present

## 2011-07-23 DIAGNOSIS — Q762 Congenital spondylolisthesis: Principal | ICD-10-CM

## 2011-07-23 DIAGNOSIS — I10 Essential (primary) hypertension: Secondary | ICD-10-CM | POA: Diagnosis present

## 2011-07-23 DIAGNOSIS — G4733 Obstructive sleep apnea (adult) (pediatric): Secondary | ICD-10-CM | POA: Diagnosis present

## 2011-07-23 DIAGNOSIS — Z79899 Other long term (current) drug therapy: Secondary | ICD-10-CM

## 2011-07-25 ENCOUNTER — Inpatient Hospital Stay (HOSPITAL_COMMUNITY): Payer: PRIVATE HEALTH INSURANCE

## 2011-07-31 NOTE — Op Note (Signed)
Jill Shah, Jill Shah NO.:  [id number]  MEDICAL RECORD NO.:  [id number]  LOCATION:  3010                         FACILITY:  [facility]  PHYSICIAN:  Tia Alert, MD     DATE OF BIRTH:  11-29-1960  DATE OF PROCEDURE:  07/23/2011 DATE OF DISCHARGE:                              OPERATIVE REPORT   PREOPERATIVE DIAGNOSES: 1. Spinal stenosis, L4-5. 2. Grade 1 spondylolisthesis, L4-5. 3. Back pain. 4. Leg pain.  POSTOPERATIVE DIAGNOSES: 1. Spinal stenosis, L4-5. 2. Grade 1 spondylolisthesis, L4-5. 3. Back pain. 4. Leg pain.  PROCEDURES: 1. Anterolateral retroperitoneal interbody fusion, L4-5, utilizing a     10 lordotic x 45 x 18-mm PEEK interbody cage packed with morselized     allograft. 2. Posterior interlaminar and facet arthrodesis, L4-5, utilizing local     autograft and morselized allograft. 3. Decompressive lumbar laminectomy, medial facetectomy, and     foraminotomy atL4-5 bilaterally for decompression of the L5 nerve     roots. 4. Nonsegmental fixation, L4-5, utilizing NuVasive pedicle screws.  SURGEON:  Tia Alert, MD  ASSISTANT:  Donalee Citrin, MD  ANESTHESIA:  General endotracheal.  COMPLICATIONS:  None apparent.  INDICATION FOR PROCEDURE:  Jill Shah is a 50 year old female who presented with back and leg pain.  She had an MRI, which showed spinal stenosis at L4-5 with spondylolisthesis.  I recommended decompression and instrumented fusion.  She understood the risks, benefits, and expected outcome and wished to proceed.  DESCRIPTION OF PROCEDURE:  The patient was taken to the operating room. After induction of adequate generalized endotracheal anesthesia, she was placed in the left lateral decubitus position exposing her right side and position in the typical excellent fashion.  She was taped into position.  AP and lateral fluoroscopies were used to identify landmarks and to identify our lateral incision.  A direct lateral  incision was made over the L4-5 interspace.  A posterolateral incision was made over the L3-4 interspace.  A blunt finger dissection was used to enter the retroperitoneal space, identified the psoas musculature and the anterior face of the transverse process.  I swept my finger up to the lateral incision and passed my first dilator down to the psoas musculature over L4-5.  We stimulated here utilizing intraoperative EMG and then got our K-wire into position utilizing lateral fluoroscopy.  We then used sequential dilation until our final retractor was in place, monitoring the entire time.  Once this was in place, we opened the retractor, removed our dilators, checked where we were with the AP and lateral fluoroscopy and then used a ball probe to assure no neural structures within the working channel.  We then opened the retractor further, checked it with AP and lateral fluoroscopy, got our intradiscal shim into position and then incised the disk space and performed our initial diskectomy with pituitary rongeurs.  I then used a Cobb curette, scraped the endplates, and released the opposite anulus.  I then used scrapers and shavers to prepare the endplates being careful not to violate the endplates.  We then passed our 16-mm paddle and checked it with AP and lateral fluoroscopy and open the opposite anulus further.  I then used sequential trials until the 10-mm lordotic trial  seemed to fit the best. We then used slides to pass a 10 lordotic x 45 x 18-mm PEEK interbody cage packed with morselized allograft.  We then removed the slides, checked our final construct with AP and lateral fluoroscopy and then removed the retractor.  I then closed these wounds in layers of 0 Vicryl in the fascia, 2-0 Vicryl in subcutaneous tissues, 3-0 Vicryl in subcuticular tissues and Dermabond on the skin.  The patient was then repositioned into the prone position on chest rolls and all pressure points were  padded.  Her lumbar region was prepped with DuraPrep and then draped in usual sterile fashion.  A dorsal midline incision was made after being marked with AP fluoroscopy.  We took down the musculature to expose L4 and L5.  We exposed our pars interarticularis at L4 and L5 bilaterally because we are going to place pars screws in the medial to lateral projection through the pedicles.  I did perform bilateral hemilaminotomies and medial facetectomies and removed the yellow ligament to decompress the L5 nerve roots bilaterally.  We then located our pedicle screw entry zones utilizing surface landmarks and AP and lateral fluoroscopy.  We then drilled each pedicle to a depth of 30- 35 mm with the hand drill.  We tapped each pedicle with a 5.0 tap and placed 5.0 x 35 mm pedicle screws in the medial to lateral projection through the pars at L4 and 30 mm screws at L5.  We then placed the tool heads and locked these into position.  We then placed our rods and locked these into position with the locking caps and antitorque device. We then decorticated the facet on the patient's left side and placed a mixture of local autograft and morselized allograft to perform posterior arthrodesis.  The wound was copiously irrigated.  The dura was lined with Gelfoam.  A medium Hemovac drain was placed and then the fascia was closed with 0 Vicryl, the subcutaneous and subcuticular tissues were closed with 2-0 and 3-0 Vicryl and the skin was closed with benzoin and Steri-Strips.  The drapes were removed.  Sterile dressing was applied. The patient was awakened from general anesthesia and transferred to the recovery room in stable condition.  At the end of the procedure, all sponge, needle, and instrument counts were correct.     Tia Alert, MD     DSJ/MEDQ  D:  07/23/2011  T:  07/24/2011  Job:  [id number]  Electronically Signed by Marikay Alar MD on 07/31/2011 02:12:35 PM

## 2011-08-05 ENCOUNTER — Other Ambulatory Visit: Payer: Self-pay | Admitting: Neurological Surgery

## 2011-08-05 ENCOUNTER — Ambulatory Visit
Admission: RE | Admit: 2011-08-05 | Discharge: 2011-08-05 | Disposition: A | Discharge status: Home / Self Care | Payer: PRIVATE HEALTH INSURANCE | Source: Ambulatory Visit | Attending: Neurological Surgery | Admitting: Neurological Surgery

## 2011-08-05 DIAGNOSIS — M545 Low back pain, unspecified: Secondary | ICD-10-CM

## 2011-08-05 DIAGNOSIS — Q762 Congenital spondylolisthesis: Secondary | ICD-10-CM

## 2011-08-11 NOTE — Discharge Summary (Signed)
Jill Shah, Jill Shah NO.:  [id number]  MEDICAL RECORD NO.:  [id number]  LOCATION:  3010                         FACILITY:  [facility]  PHYSICIAN:  Tia Alert, MD     DATE OF BIRTH:  1961/01/12  DATE OF ADMISSION:  07/23/2011 DATE OF DISCHARGE:  07/26/2011                              DISCHARGE SUMMARY   ADMITTING DIAGNOSIS:  Spondylolisthesis and spinal stenosis, L4-5.  PROCEDURE:  Extreme lateral interbody fusion with decompression and pedicle screws, L4-5.  HISTORY OF PRESENT ILLNESS:  Jill Shah is a very pleasant 50- year-old female who presented with back and leg pain.  She had an MRI which showed severe spinal stenosis at L4-5 with spondylolisthesis.  I recommended decompression instrumented fusion.  She understood the risks, benefits, expected outcome, and wished to proceed.  HOSPITAL COURSE:  The patient was admitted on July 23, 2011, taken to the operating room where she underwent an XLIF with pedicle screws at L4- 5.  The patient tolerated the procedure well and was taken to recovery room and then to the floor in stable condition.  For details of the operative procedure, please see the dictated operative note.  The patient's hospital course was routine.  There were no complications. Her wounds remained clean, dry, and intact.  She had appropriate back soreness.  She had no significant leg pain or weakness.  She ambulated in the halls without difficulty.  She did well with physical or occupational therapy.  She tolerated regular diet.  She was discharged home in stable condition on July 26, 2011 with plans to follow up in 2 weeks.  FINAL DIAGNOSIS:  Extreme lateral interbody fusion with pedicle screws, L4-5.     Tia Alert, MD     DSJ/MEDQ  D:  08/11/2011  T:  08/11/2011  Job:  [id number]

## 2011-08-19 ENCOUNTER — Other Ambulatory Visit (HOSPITAL_COMMUNITY): Payer: Self-pay | Admitting: Family Medicine

## 2011-08-19 ENCOUNTER — Ambulatory Visit (HOSPITAL_COMMUNITY)
Admission: RE | Admit: 2011-08-19 | Discharge: 2011-08-19 | Disposition: A | Discharge status: Home / Self Care | Payer: No Typology Code available for payment source | Source: Ambulatory Visit | Attending: Family Medicine | Admitting: Family Medicine

## 2011-08-19 DIAGNOSIS — Z09 Encounter for follow-up examination after completed treatment for conditions other than malignant neoplasm: Secondary | ICD-10-CM | POA: Insufficient documentation

## 2011-08-19 DIAGNOSIS — R52 Pain, unspecified: Secondary | ICD-10-CM

## 2011-08-19 DIAGNOSIS — J984 Other disorders of lung: Secondary | ICD-10-CM | POA: Insufficient documentation

## 2011-09-01 ENCOUNTER — Other Ambulatory Visit (HOSPITAL_COMMUNITY): Payer: Self-pay | Admitting: Family Medicine

## 2011-09-01 DIAGNOSIS — Z1231 Encounter for screening mammogram for malignant neoplasm of breast: Secondary | ICD-10-CM

## 2011-09-05 ENCOUNTER — Ambulatory Visit (HOSPITAL_COMMUNITY)
Admission: RE | Admit: 2011-09-05 | Discharge: 2011-09-05 | Disposition: A | Discharge status: Home / Self Care | Payer: Self-pay | Source: Ambulatory Visit | Attending: Family Medicine | Admitting: Family Medicine

## 2011-09-05 DIAGNOSIS — Z1231 Encounter for screening mammogram for malignant neoplasm of breast: Secondary | ICD-10-CM

## 2011-10-14 ENCOUNTER — Other Ambulatory Visit: Payer: Self-pay | Admitting: Neurological Surgery

## 2011-10-14 ENCOUNTER — Ambulatory Visit
Admission: RE | Admit: 2011-10-14 | Discharge: 2011-10-14 | Disposition: A | Discharge status: Home / Self Care | Payer: PRIVATE HEALTH INSURANCE | Source: Ambulatory Visit | Attending: Neurological Surgery | Admitting: Neurological Surgery

## 2011-10-14 DIAGNOSIS — M545 Low back pain, unspecified: Secondary | ICD-10-CM

## 2012-01-08 ENCOUNTER — Other Ambulatory Visit (HOSPITAL_COMMUNITY)
Admission: RE | Admit: 2012-01-08 | Discharge: 2012-01-08 | Disposition: A | Discharge status: Home / Self Care | Payer: Self-pay | Source: Ambulatory Visit | Attending: Family Medicine | Admitting: Family Medicine

## 2012-01-08 ENCOUNTER — Other Ambulatory Visit: Payer: Self-pay | Admitting: Family Medicine

## 2012-01-08 DIAGNOSIS — Z01419 Encounter for gynecological examination (general) (routine) without abnormal findings: Secondary | ICD-10-CM | POA: Insufficient documentation

## 2012-04-02 IMAGING — CR DG LUMBAR SPINE 2-3V
3 series · 3 of 3 positions shown · non-contrast
Comparison: 07/25/2011

CLINICAL DATA: Right lower back pain, lumbar surgery 2 weeks ago

LUMBAR SPINE - 2-3 VIEW

[t l-spine a.p.]
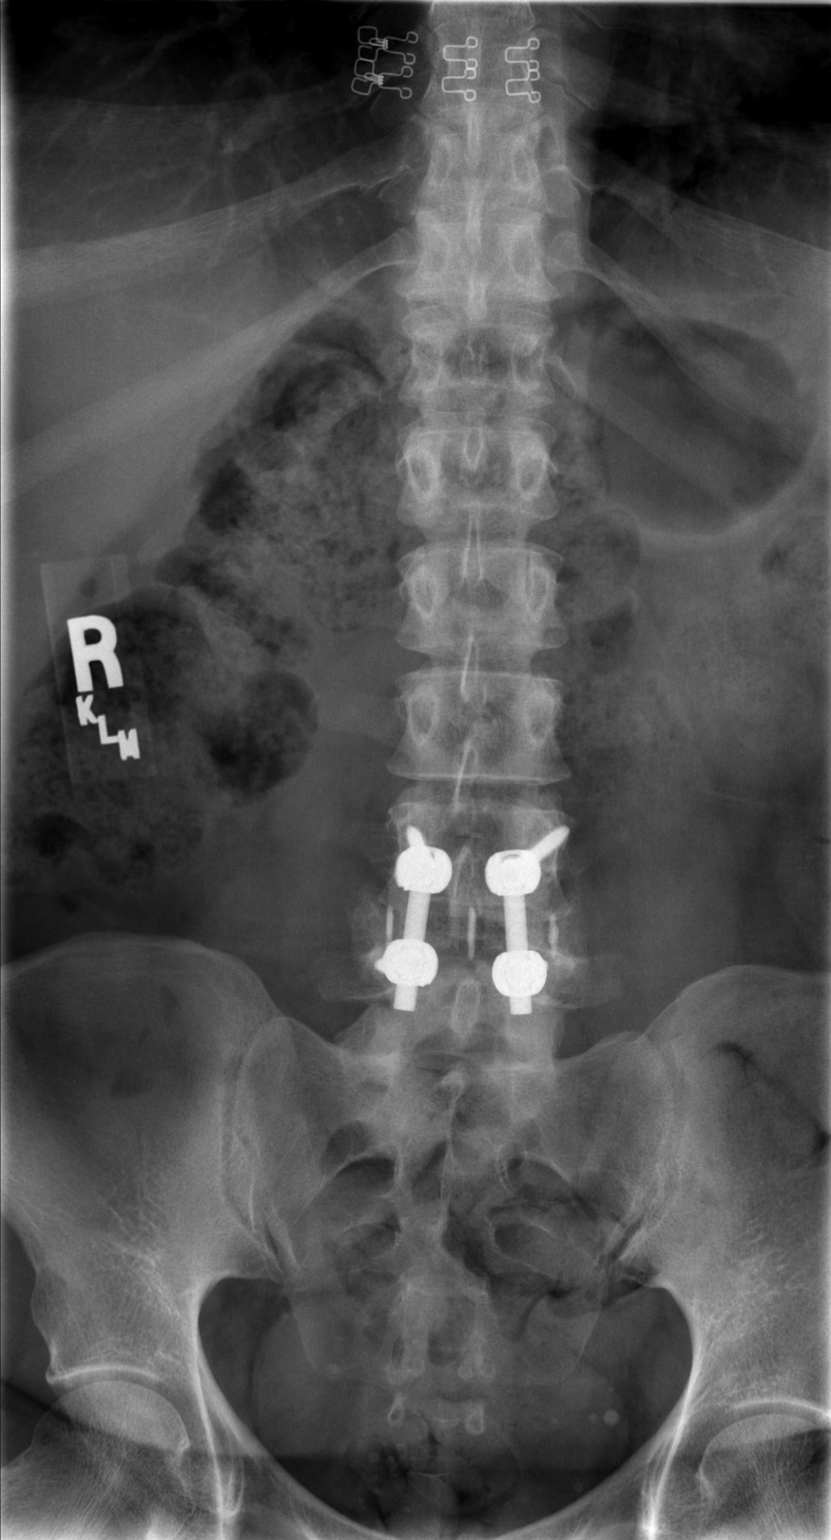

[t l-spine lat]
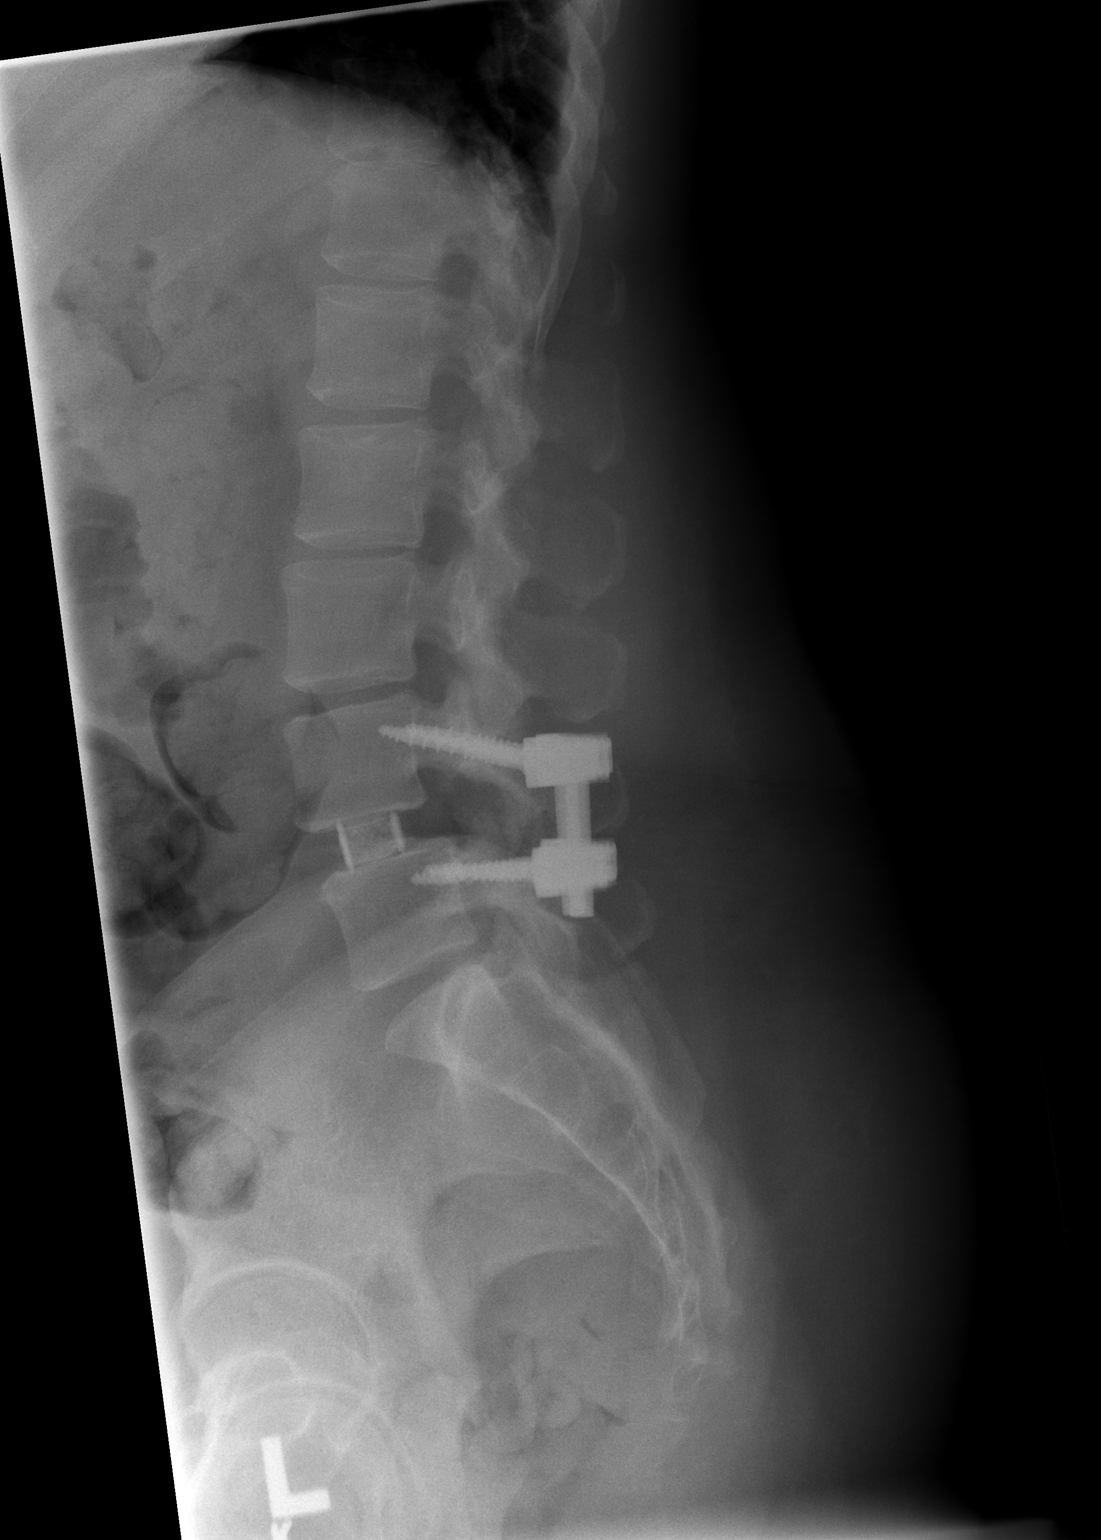

[t l-spine l5-s1 spot]
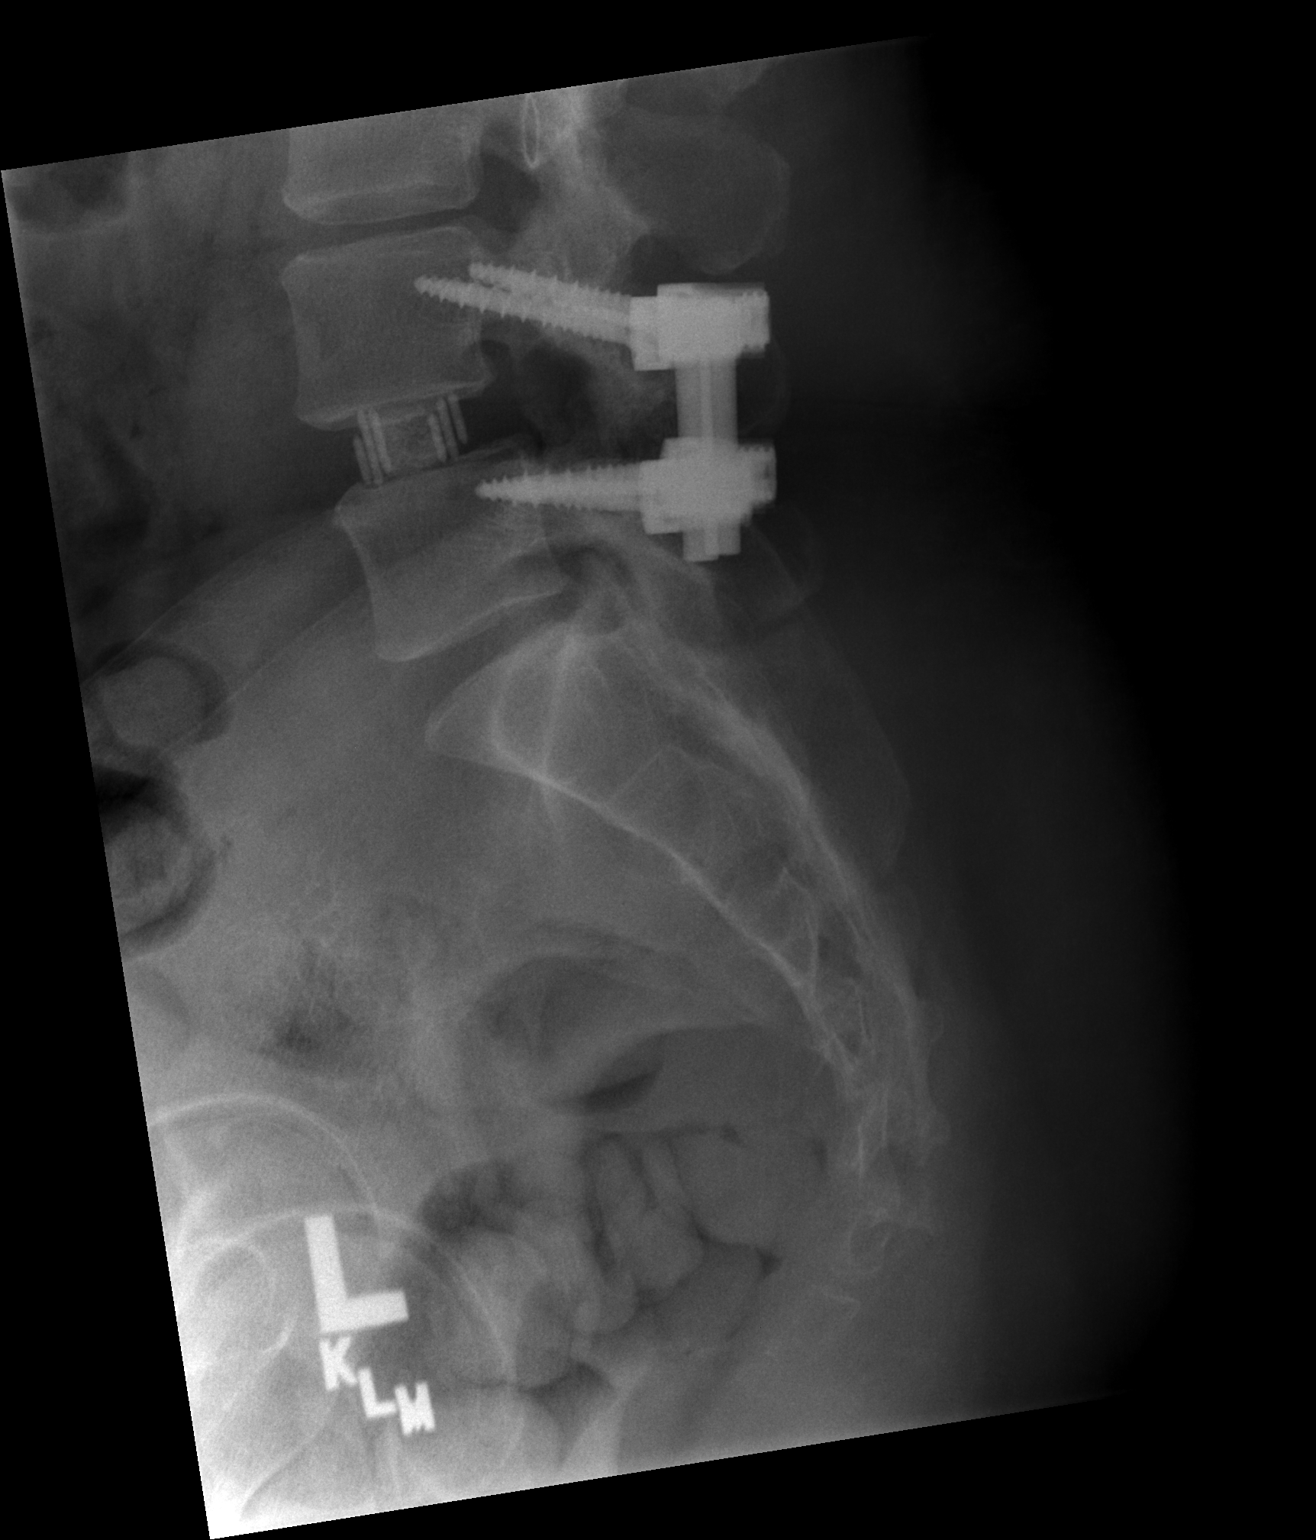

[3 of 3 positions shown; findings below may reference images not displayed]

FINDINGS: Posterior lumbar fixation with interbody fusion at L4-5.
No evidence of hardware complication.

No evidence of fracture or dislocation.  Vertebral body heights and
two spaces are maintained.

Visualized bony pelvis appears intact.
IMPRESSION: Stable appearance status post PLIF at L4-5.

## 2012-04-16 IMAGING — CR DG CHEST 2V
2 series · 2 of 2 positions shown · non-contrast
Comparison: 04/21/2011

CLINICAL DATA: Follow up lung nodule

CHEST - 2 VIEW

[w chest pa]
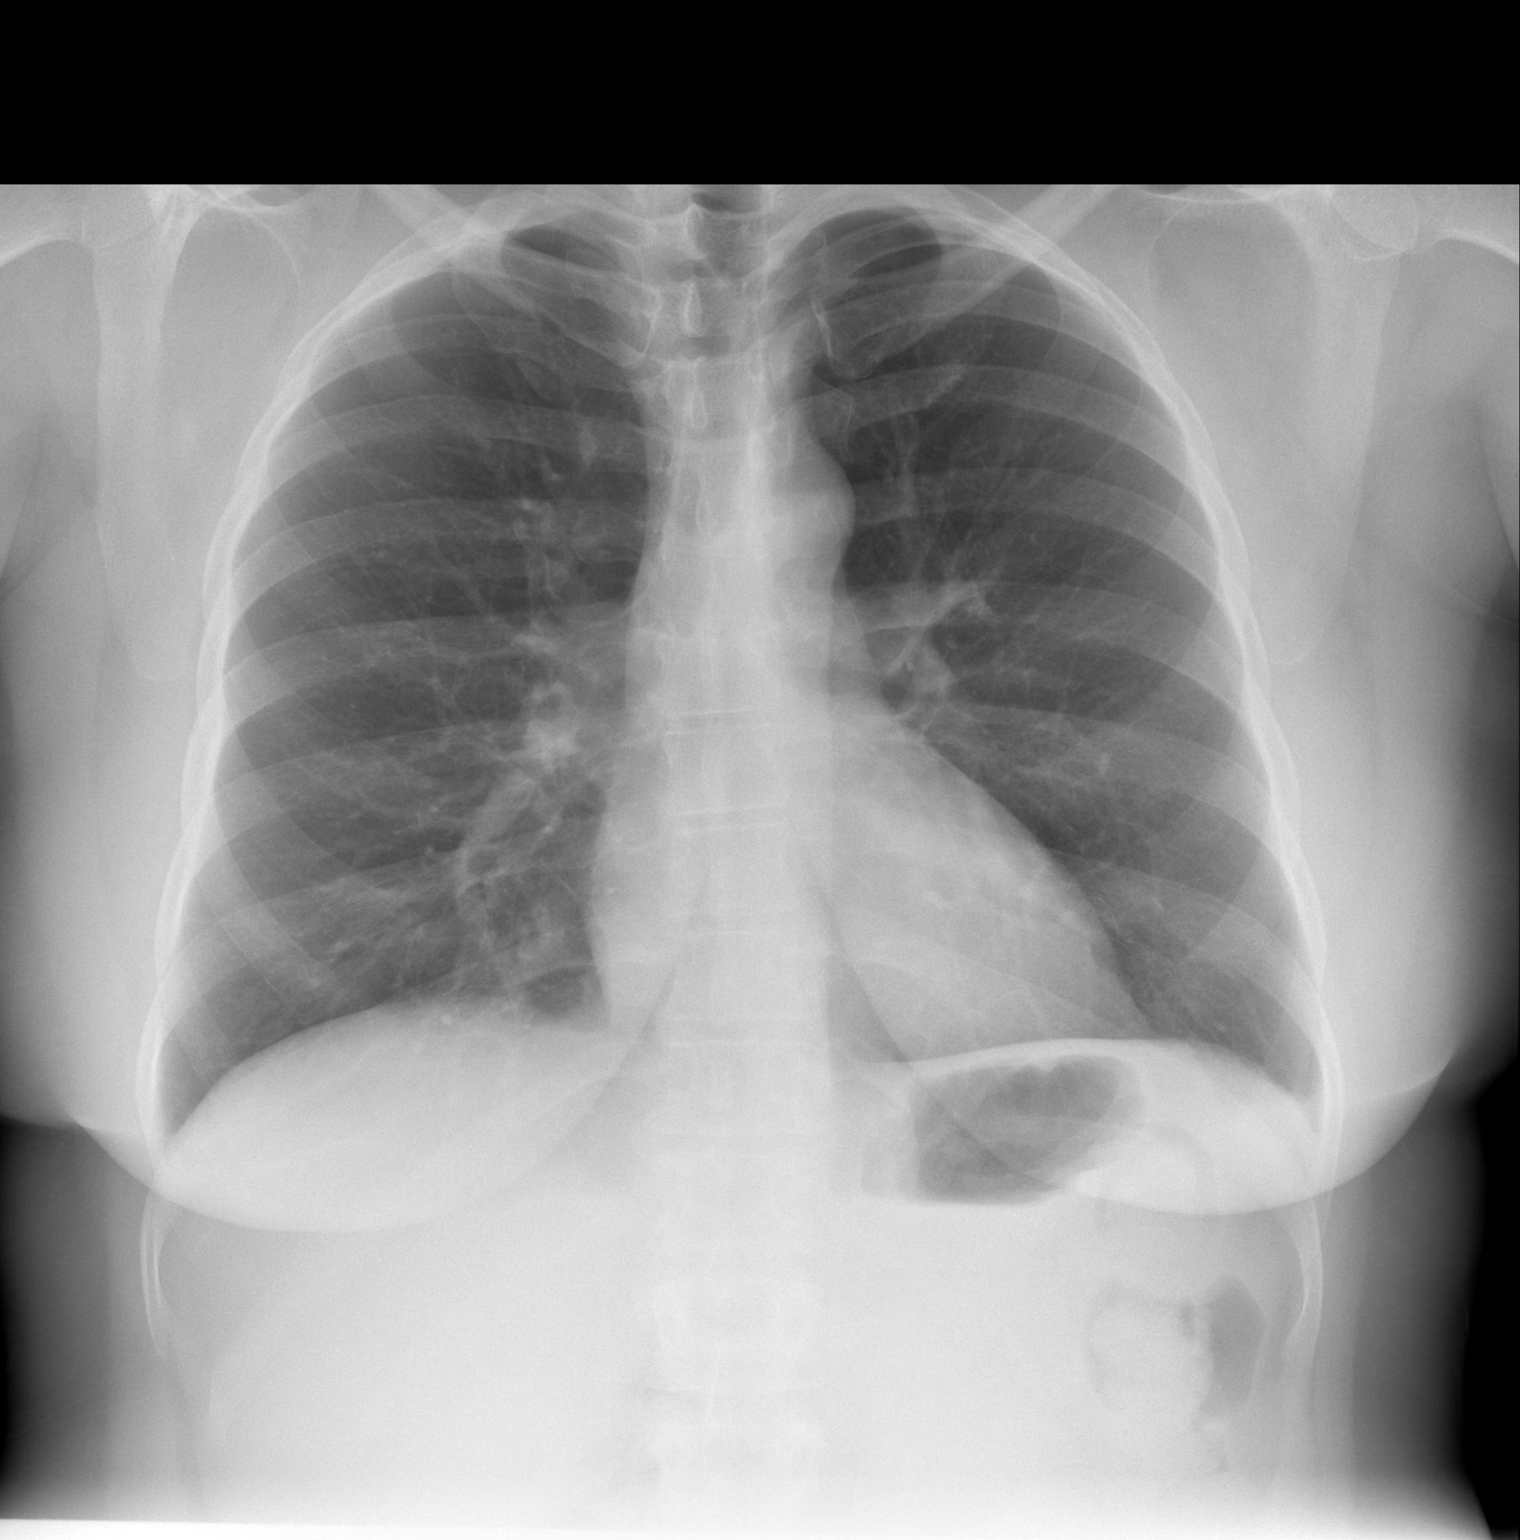

[w chest lat *]
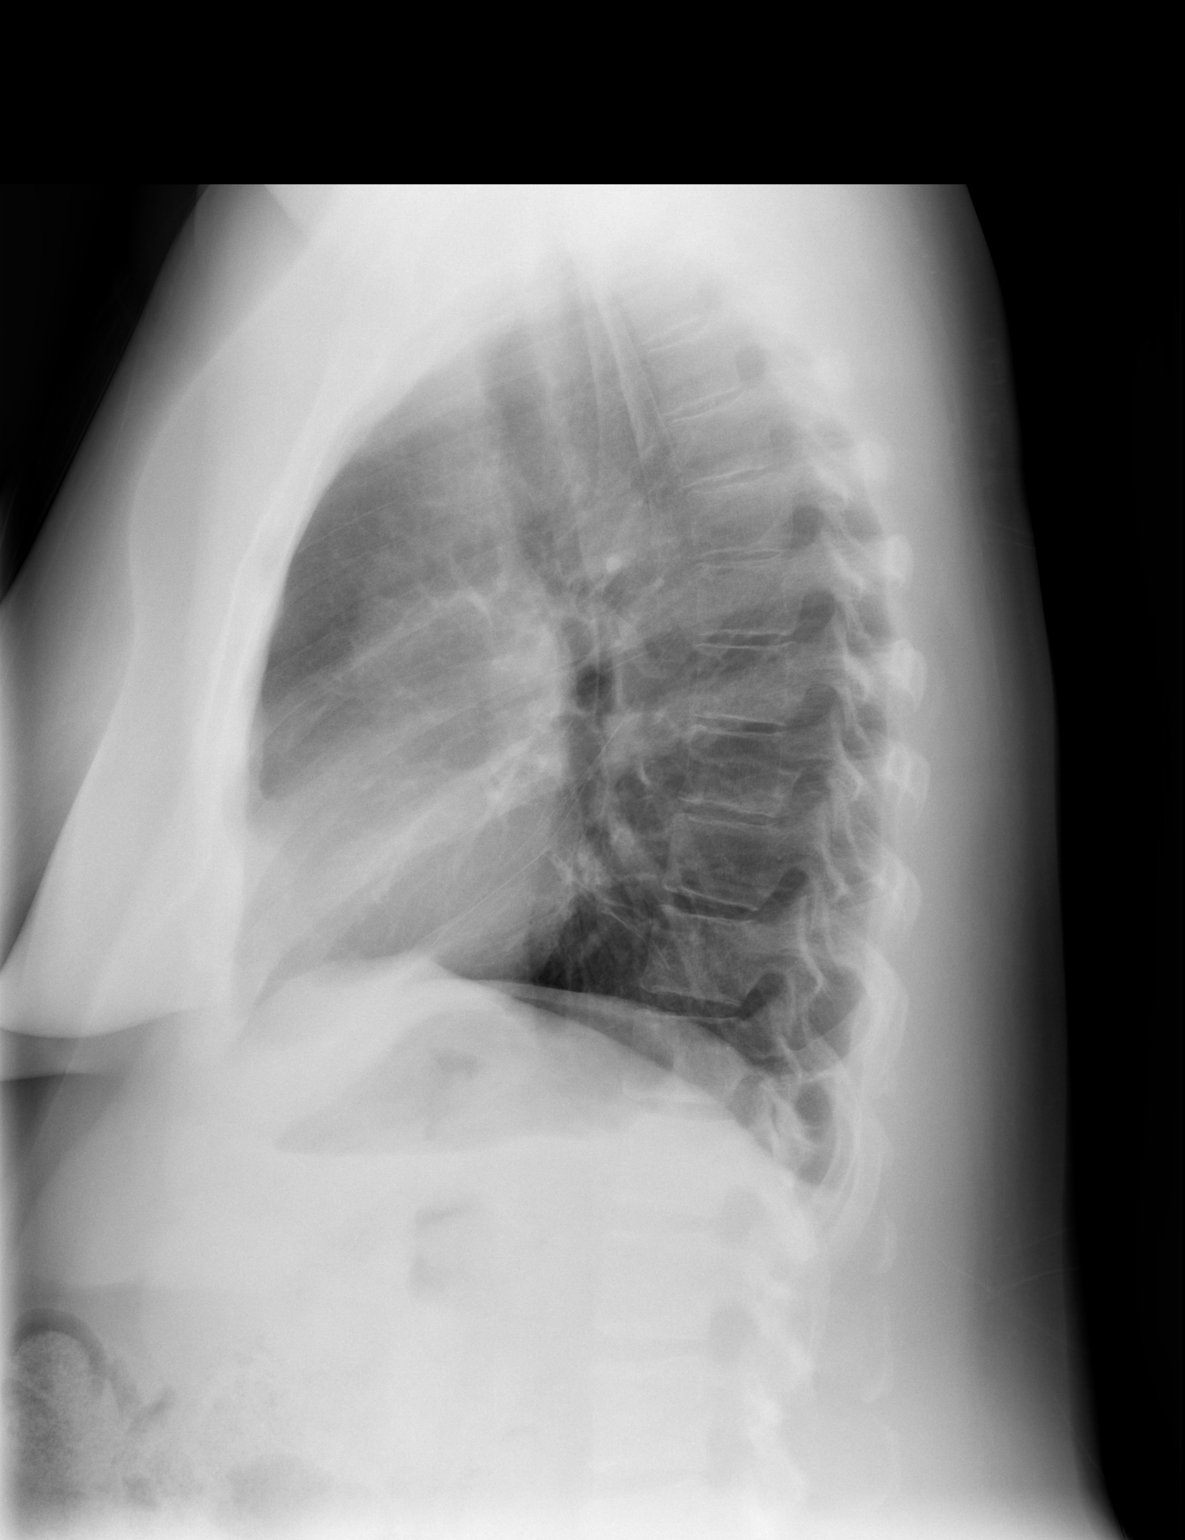

[2 of 2 positions shown; findings below may reference images not displayed]

FINDINGS: The 6 mm the ground-glass pulmonary nodule described on
CT from 04/21/2011 is below the resolution of plain film
radiograph.  This would need to be followed up with a CT of the
chest.

The heart size and mediastinal contours are within normal limits.
Both lungs are clear.  The visualized skeletal structures are
unremarkable.
IMPRESSION: 1.  No active cardiopulmonary abnormalities.

## 2012-08-13 ENCOUNTER — Other Ambulatory Visit: Payer: Self-pay | Admitting: Specialist

## 2012-08-13 ENCOUNTER — Ambulatory Visit
Admission: RE | Admit: 2012-08-13 | Discharge: 2012-08-13 | Disposition: A | Discharge status: Home / Self Care | Payer: Medicaid Other | Source: Ambulatory Visit | Attending: Specialist | Admitting: Specialist

## 2012-08-13 DIAGNOSIS — R911 Solitary pulmonary nodule: Secondary | ICD-10-CM

## 2012-08-16 ENCOUNTER — Other Ambulatory Visit: Payer: Self-pay | Admitting: Physician Assistant

## 2012-08-16 DIAGNOSIS — R102 Pelvic and perineal pain: Secondary | ICD-10-CM

## 2012-08-18 ENCOUNTER — Other Ambulatory Visit: Payer: Medicaid Other

## 2012-08-19 ENCOUNTER — Other Ambulatory Visit: Payer: Medicaid Other

## 2012-08-19 ENCOUNTER — Other Ambulatory Visit: Payer: Self-pay | Admitting: Specialist

## 2012-08-19 DIAGNOSIS — R102 Pelvic and perineal pain: Secondary | ICD-10-CM

## 2012-08-20 ENCOUNTER — Ambulatory Visit
Admission: RE | Admit: 2012-08-20 | Discharge: 2012-08-20 | Disposition: A | Discharge status: Home / Self Care | Payer: Medicaid Other | Source: Ambulatory Visit | Attending: Physician Assistant | Admitting: Physician Assistant

## 2012-08-20 DIAGNOSIS — R102 Pelvic and perineal pain: Secondary | ICD-10-CM

## 2015-01-29 ENCOUNTER — Encounter: Payer: Self-pay | Admitting: Gastroenterology

## 2015-04-12 ENCOUNTER — Encounter: Payer: Medicaid Other | Admitting: Gastroenterology

## 2015-05-14 ENCOUNTER — Ambulatory Visit (AMBULATORY_SURGERY_CENTER): Payer: Self-pay

## 2015-05-14 VITALS — Ht 60.0 in | Wt 195.0 lb

## 2015-05-14 DIAGNOSIS — Z1211 Encounter for screening for malignant neoplasm of colon: Secondary | ICD-10-CM

## 2015-05-14 MED ORDER — SUPREP BOWEL PREP KIT 17.5-3.13-1.6 GM/177ML PO SOLN: 1.0000 | Freq: Once | ORAL | Status: DC

## 2015-05-14 NOTE — Progress Notes (Signed)
No allergies to eggs or soy No diet/weight loss meds No home oxygen No past problems with anesthesia  Refused emmi 

## 2015-05-24 ENCOUNTER — Ambulatory Visit (AMBULATORY_SURGERY_CENTER): Payer: 59 | Admitting: Gastroenterology

## 2015-05-24 ENCOUNTER — Encounter: Payer: Self-pay | Admitting: Gastroenterology

## 2015-05-24 ENCOUNTER — Encounter: Payer: Medicaid Other | Admitting: Gastroenterology

## 2015-05-24 VITALS — BP 123/82 | HR 62 | Temp 97.0°F | Resp 62 | Ht 60.0 in | Wt 195.0 lb

## 2015-05-24 DIAGNOSIS — Z1211 Encounter for screening for malignant neoplasm of colon: Secondary | ICD-10-CM | POA: Diagnosis not present

## 2015-05-24 MED ORDER — SODIUM CHLORIDE 0.9 % IV SOLN: 500.0000 mL | INTRAVENOUS | Status: DC

## 2015-05-24 NOTE — Progress Notes (Signed)
Transferred to recovery room. A/O x3, pleased with MAC.  VSS.  Report to Jane, RN. 

## 2015-05-24 NOTE — Patient Instructions (Signed)
YOU HAD AN ENDOSCOPIC PROCEDURE TODAY AT [facility]:   Refer to the procedure report that was given to you for any specific questions about what was found during the examination.  If the procedure report does not answer your questions, please call your gastroenterologist to clarify.  If you requested that your care partner not be given the details of your procedure findings, then the procedure report has been included in a sealed envelope for you to review at your convenience later.  YOU SHOULD EXPECT: Some feelings of bloating in the abdomen. Passage of more gas than usual.  Walking can help get rid of the air that was put into your GI tract during the procedure and reduce the bloating. If you had a lower endoscopy (such as a colonoscopy or flexible sigmoidoscopy) you may notice spotting of blood in your stool or on the toilet paper. If you underwent a bowel prep for your procedure, you may not have a normal bowel movement for a few days.  Please Note:  You might notice some irritation and congestion in your nose or some drainage.  This is from the oxygen used during your procedure.  There is no need for concern and it should clear up in a day or so.  SYMPTOMS TO REPORT IMMEDIATELY:   Following lower endoscopy (colonoscopy or flexible sigmoidoscopy):  Excessive amounts of blood in the stool  Significant tenderness or worsening of abdominal pains  Swelling of the abdomen that is new, acute  Fever of 100F or higher  For urgent or emergent issues, a gastroenterologist can be reached at any hour by calling [phone].   DIET: Your first meal following the procedure should be a small meal and then it is ok to progress to your normal diet. Heavy or fried foods are harder to digest and may make you feel nauseous or bloated.  Likewise, meals heavy in dairy and vegetables can increase bloating.  Drink plenty of fluids but you should avoid alcoholic beverages for 24  hours.  ACTIVITY:  You should plan to take it easy for the rest of today and you should NOT DRIVE or use heavy machinery until tomorrow (because of the sedation medicines used during the test).    FOLLOW UP: Our staff will call the number listed on your records the next business day following your procedure to check on you and address any questions or concerns that you may have regarding the information given to you following your procedure. If we do not reach you, we will leave a message.  However, if you are feeling well and you are not experiencing any problems, there is no need to return our call.  We will assume that you have returned to your regular daily activities without incident.  If any biopsies were taken you will be contacted by phone or by letter within the next 1-3 weeks.  Please call us at [phone] if you have not heard about the biopsies in 3 weeks.    SIGNATURES/CONFIDENTIALITY: You and/or your care partner have signed paperwork which will be entered into your electronic medical record.  These signatures attest to the fact that that the information above on your After Visit Summary has been reviewed and is understood.  Full responsibility of the confidentiality of this discharge information lies with you and/or your care-partner.  Normal colonoscopy- repeat in 10 years-2026

## 2015-05-24 NOTE — Op Note (Signed)
[place]  [address] [place], [id number]   COLONOSCOPY PROCEDURE REPORT  PATIENT: Jill Shah, Jill Shah  MR#: [id number] BIRTHDATE: 02-10-1961 , 32  yrs. old GENDER: female ENDOSCOPIST: Inda Castle, MD REFERRED BY: PROCEDURE DATE:  05/24/2015 PROCEDURE:   Colonoscopy, screening First Screening Colonoscopy - Avg.  risk and is 50 yrs.  old or older Yes.  Prior Negative Screening - Now for repeat screening. N/A  History of Adenoma - Now for follow-up colonoscopy & has been > or = to 3 yrs.  N/A  Polyps removed today? No Recommend repeat exam, <10 yrs? No ASA CLASS:   Class II INDICATIONS:Colorectal Neoplasm Risk Assessment for this procedure is average risk. MEDICATIONS: Monitored anesthesia care and Propofol 250 mg IV  DESCRIPTION OF PROCEDURE:   After the risks benefits and alternatives of the procedure were thoroughly explained, informed consent was obtained.  The digital rectal exam revealed no abnormalities of the rectum.   The [id number]  endoscope was introduced through the anus and advanced to the cecum, which was identified by both the appendix and ileocecal valve. No adverse events experienced.   The quality of the prep was (Suprep was used) excellent.  The instrument was then slowly withdrawn as the colon was fully examined. Estimated blood loss is zero unless otherwise noted in this procedure report.      COLON FINDINGS: A normal appearing cecum, ileocecal valve, and appendiceal orifice were identified.  The ascending, transverse, descending, sigmoid colon, and rectum appeared unremarkable. Retroflexed views revealed no abnormalities. The time to cecum = 3.2 Withdrawal time = 6.4   The scope was withdrawn and the procedure completed. COMPLICATIONS: There were no immediate complications.  ENDOSCOPIC IMPRESSION: Normal colonoscopy  RECOMMENDATIONS: Continue current colorectal screening recommendations for "routine risk"  patients with a repeat colonoscopy in 10 years.  eSigned:  Inda Castle, MD 05/24/2015 11:17 AM   cc: York Ram, MD

## 2015-05-25 ENCOUNTER — Telehealth: Payer: Self-pay | Admitting: *Deleted

## 2015-05-25 NOTE — Telephone Encounter (Signed)
  Follow up Call-  Call back number 05/24/2015  Post procedure Call Back phone  # [phone]  Permission to leave phone message Yes     Patient questions:  Do you have a fever, pain , or abdominal swelling? No. Pain Score  0 *  Have you tolerated food without any problems? Yes.    Have you been able to return to your normal activities? Yes.    Do you have any questions about your discharge instructions: Diet   No. Medications  No. Follow up visit  No.  Do you have questions or concerns about your Care? Yes.    Actions: * If pain score is 4 or above: No action needed, pain <4.

## 2015-07-18 ENCOUNTER — Telehealth: Payer: Self-pay | Admitting: Cardiovascular Disease

## 2015-07-18 NOTE — Telephone Encounter (Signed)
Received records from [facility] for appointment on 08/08/15 with Dr Oval Linsey.  Records given to [facility] (medical records) for Dr Blenda Mounts schedule on 08/08/15. lp

## 2015-08-07 NOTE — Progress Notes (Signed)
Cardiology Office Note   Date:  08/08/2015   ID:  Jill Shah, DOB 08-06-1961, MRN [id number]  PCP:  PROVIDER NOT IN SYSTEM  Cardiologist:   Sharol Harness, MD   Chief Complaint  Patient presents with  . New Evaluation    pt states feeling lightheaded and dizzy  . Chest Pain    constant  . Edema    some edema in both feet and ankles  . Shortness of Breath    only when walking up steps      History of Present Illness: Jill Shah is a 54 y.o. female with hypertension, hyperlipidemia and obesity who presents for an evaluation of chest pain.  Jill Shah saw Dr. Rogers Blocker on 07/09/15, at which time she reported chest pain. She had an EKG that was unremarkable and she was referred to cardiology for further evaluation. She reports that she's had constant chest pain for 3-4 months now. Pressure in the center of chest that ranges from 6-10 out of 10 in severity. It is not associated with exertion. She does note that it tends to happen more frequently and worse later in the day. Does not happen when laying down in bed.  It is occasionally associated with shortness of breath, nausea, and diaphoresis. She walks 30 minutes every other day and does not think that the chest pressure gets worse with this exertion. She has been walking less frequently now that the weather has been getting cooler.   Jill Shah has noted mild lower extremity edema. It always improves with elevation. She denies orthopnea or PND. She has noted some lightheadedness when she first gets up out of the bed in the mornings. She is constantly tired which she attributes to her chronic anemia.   Past Medical History  Diagnosis Date  . Hypertension   . Hypercholesterolemia     Past Surgical History  Procedure Laterality Date  . Fibroid ablation    . Cesarean section      x2  . Tubal ligation       Current Outpatient Prescriptions  Medication Sig Dispense Refill  .  aspirin 81 MG tablet Take 81 mg by mouth daily.    . Ferrous Sulfate (IRON) 325 (65 FE) MG TABS Take by mouth. Takes 2-3/day     No current facility-administered medications for this visit.    Allergies:   Hydrocodone    Social History:  The patient  reports that she has never smoked. She has never used smokeless tobacco. She reports that she does not drink alcohol or use illicit drugs.   Family History:  The patient's family history includes Diabetes in her mother; Lung cancer in her mother; Other in her brother; Pancreatic cancer in her sister. There is no history of Colon cancer or Colon polyps.    ROS:  Please see the history of present illness.   Otherwise, review of systems are positive for none.   All other systems are reviewed and negative.    PHYSICAL EXAM: VS:  BP 134/88 mmHg  Pulse 84  Ht 5' (1.524 m)  Wt 89.495 kg (197 lb 4.8 oz)  BMI 38.53 kg/m2 , BMI Body mass index is 38.53 kg/(m^2). GENERAL:  Well appearing HEENT:  Pupils equal round and reactive, fundi not visualized, oral mucosa unremarkable NECK:  No jugular venous distention, waveform within normal limits, carotid upstroke brisk and symmetric, no bruits, no thyromegaly LYMPHATICS:  No cervical adenopathy LUNGS:  Clear to auscultation bilaterally HEART:  RRR.  PMI not displaced or sustained,S1 and S2 within normal limits, no S3, no S4, no clicks, no rubs, no murmurs ABD:  Flat, positive bowel sounds normal in frequency in pitch, no bruits, no rebound, no guarding, no midline pulsatile mass, no hepatomegaly, no splenomegaly EXT:  2 plus pulses throughout, no edema, no cyanosis no clubbing SKIN:  No rashes no nodules NEURO:  Cranial nerves II through XII grossly intact, motor grossly intact throughout PSYCH:  Cognitively intact, oriented to person place and time  EKG:  EKG is ordered today. The ekg ordered today demonstrates sinus rhythm.  Rate 84 bpm.     Recent Labs: No results found for requested labs  within last 365 days.    Lipid Panel    Component Value Date/Time   CHOL 242* 08/15/2010 2253   TRIG 73 08/15/2010 2253   HDL 58 08/15/2010 2253   CHOLHDL 4.2 Ratio 08/15/2010 2253   VLDL 15 08/15/2010 2253   LDLCALC 169* 08/15/2010 2253      Wt Readings from Last 3 Encounters:  08/08/15 89.495 kg (197 lb 4.8 oz)  05/24/15 88.451 kg (195 lb)  05/14/15 88.451 kg (195 lb)      ASSESSMENT AND PLAN:  # Atypical chest pain: Jill Shah' symptoms are quite atypical. The fact that she has chest pain that lasts all day makes it unlikely that this is due to ischemia. However, we will pursue a treadmill stress tests to rule out ischemia, as this could be an atypical presentation. She does have risk factors including hypertension and hyperlipidemia.  # Hypertension:  BP well-controlled off all medications.  # Hyperlipidemia: Jill Shah carries a diagnosis of hyperlipidemia. However she is not on any medications. We'll check a fasting lipid panel to determine if she is to make additional less than changes or start any medications.  # LE Edema, dyspnea on exertion: We will obtain a echocardiogram to evaluate for heart failure.   Current medicines are reviewed at length with the patient today.  The patient does not have concerns regarding medicines.  The following changes have been made:  no change  Labs/ tests ordered today include:   Orders Placed This Encounter  Procedures  . Comprehensive metabolic panel  . Lipid panel  . Exercise Tolerance Test  . EKG 12-Lead  . ECHOCARDIOGRAM COMPLETE     Disposition:   FU with Reise Hietala C. Oval Linsey, MD in 6 months.   Signed, Sharol Harness, MD  08/08/2015 10:45 PM    [place]

## 2015-08-08 ENCOUNTER — Encounter: Payer: Self-pay | Admitting: Cardiovascular Disease

## 2015-08-08 ENCOUNTER — Ambulatory Visit (INDEPENDENT_AMBULATORY_CARE_PROVIDER_SITE_OTHER): Payer: 59 | Admitting: Cardiovascular Disease

## 2015-08-08 VITALS — BP 134/88 | HR 84 | Ht 60.0 in | Wt 197.3 lb

## 2015-08-08 DIAGNOSIS — R0609 Other forms of dyspnea: Secondary | ICD-10-CM

## 2015-08-08 DIAGNOSIS — R072 Precordial pain: Secondary | ICD-10-CM

## 2015-08-08 DIAGNOSIS — R600 Localized edema: Secondary | ICD-10-CM

## 2015-08-08 DIAGNOSIS — Z79899 Other long term (current) drug therapy: Secondary | ICD-10-CM | POA: Diagnosis not present

## 2015-08-08 DIAGNOSIS — R0600 Dyspnea, unspecified: Secondary | ICD-10-CM

## 2015-08-08 DIAGNOSIS — Z13220 Encounter for screening for lipoid disorders: Secondary | ICD-10-CM | POA: Diagnosis not present

## 2015-08-08 NOTE — Patient Instructions (Signed)
  Your physician wants you to follow-up in [place].  You will receive a reminder letter in the mail two months in advance. If you don't receive a letter, please call our office to schedule the follow-up appointment.  WILL CONTACT YOU WITH RESULTS.  Your physician has requested that you have an echocardiogram AT [address]. Echocardiography is a painless test that uses sound waves to create images of your heart. It provides your doctor with information about the size and shape of your heart and how well your heart's chambers and valves are working. This procedure takes approximately one hour. There are no restrictions for this procedure.  Your physician has requested that you have an exercise tolerance test AT [address]. For further information please visit [email or web address]. Please also follow instruction sheet, as given.  LABS- LIPIDS , CMP- DO NOT EAT OR DRINK THE MORNING OF THE TEST.

## 2015-08-10 LAB — COMPREHENSIVE METABOLIC PANEL
ALBUMIN: 4.2 g/dL (ref 3.6–5.1)
ALK PHOS: 65 U/L (ref 33–130)
ALT: 20 U/L (ref 6–29)
AST: 21 U/L (ref 10–35)
BILIRUBIN TOTAL: 0.5 mg/dL (ref 0.2–1.2)
BUN: 8 mg/dL (ref 7–25)
CHLORIDE: 106 mmol/L (ref 98–110)
CO2: 28 mmol/L (ref 20–31)
CREATININE: 0.81 mg/dL (ref 0.50–1.05)
Calcium: 8.9 mg/dL (ref 8.6–10.4)
Glucose, Bld: 99 mg/dL (ref 65–99)
Potassium: 4 mmol/L (ref 3.5–5.3)
SODIUM: 144 mmol/L (ref 135–146)
TOTAL PROTEIN: 7.3 g/dL (ref 6.1–8.1)

## 2015-08-10 LAB — LIPID PANEL
CHOLESTEROL: 267 mg/dL — AB (ref 125–200)
HDL: 54 mg/dL (ref >=46)
LDL Cholesterol: 190 mg/dL — ABNORMAL HIGH (ref <130)
TRIGLYCERIDES: 117 mg/dL (ref <150)
Total CHOL/HDL Ratio: 4.9 Ratio (ref <=5.0)
VLDL: 23 mg/dL (ref <30)

## 2015-08-14 ENCOUNTER — Ambulatory Visit (HOSPITAL_COMMUNITY): Payer: 59 | Attending: Cardiology

## 2015-08-14 ENCOUNTER — Other Ambulatory Visit: Payer: Self-pay

## 2015-08-14 DIAGNOSIS — E7800 Pure hypercholesterolemia, unspecified: Secondary | ICD-10-CM | POA: Diagnosis not present

## 2015-08-14 DIAGNOSIS — Z13220 Encounter for screening for lipoid disorders: Secondary | ICD-10-CM

## 2015-08-14 DIAGNOSIS — I340 Nonrheumatic mitral (valve) insufficiency: Secondary | ICD-10-CM | POA: Diagnosis not present

## 2015-08-14 DIAGNOSIS — R0600 Dyspnea, unspecified: Secondary | ICD-10-CM

## 2015-08-14 DIAGNOSIS — R600 Localized edema: Secondary | ICD-10-CM | POA: Diagnosis not present

## 2015-08-14 DIAGNOSIS — I10 Essential (primary) hypertension: Secondary | ICD-10-CM | POA: Diagnosis not present

## 2015-08-14 DIAGNOSIS — Z79899 Other long term (current) drug therapy: Secondary | ICD-10-CM | POA: Diagnosis not present

## 2015-08-14 DIAGNOSIS — R609 Edema, unspecified: Secondary | ICD-10-CM | POA: Insufficient documentation

## 2015-08-14 DIAGNOSIS — R0609 Other forms of dyspnea: Secondary | ICD-10-CM | POA: Insufficient documentation

## 2015-08-14 DIAGNOSIS — I5189 Other ill-defined heart diseases: Secondary | ICD-10-CM | POA: Diagnosis not present

## 2015-08-14 DIAGNOSIS — I071 Rheumatic tricuspid insufficiency: Secondary | ICD-10-CM | POA: Insufficient documentation

## 2015-08-15 ENCOUNTER — Telehealth: Payer: Self-pay | Admitting: *Deleted

## 2015-08-15 NOTE — Telephone Encounter (Signed)
LEFT MESSAGE TO CAL BACK 

## 2015-08-15 NOTE — Telephone Encounter (Signed)
-----   Message from Skeet Latch, MD sent at 08/15/2015  9:12 AM EST ----- Cholesterol numbers are high, but not high enough to start medications.  Work on increasing exercise to at least 30-40 minutes most days of the week.  Increase lean meats, fruits and veggies. Limit fried foods, fatty foods and red meat.

## 2015-08-16 NOTE — Telephone Encounter (Signed)
Spoke to patient. Result given . Verbalized understanding  

## 2015-08-21 ENCOUNTER — Telehealth: Payer: Self-pay | Admitting: *Deleted

## 2015-08-21 NOTE — Telephone Encounter (Signed)
Unable to leave message - voicemail full Will try again

## 2015-08-21 NOTE — Telephone Encounter (Signed)
-----   Message from Skeet Latch, MD sent at 08/19/2015  7:35 PM EST ----- Echo shows that her heart does not relax completely.  It will be important to keep her blood pressure under good control to prevent this from getting worse.  Otherwise normal echo.

## 2015-08-22 NOTE — Telephone Encounter (Signed)
Unable to leave message mailbox full.

## 2015-08-22 NOTE — Telephone Encounter (Signed)
Pt is returning your call about some results  Thanks

## 2015-08-22 NOTE — Telephone Encounter (Signed)
Spoke to patient. Result given . Verbalized understanding  

## 2015-09-05 ENCOUNTER — Telehealth (HOSPITAL_COMMUNITY): Payer: Self-pay

## 2015-09-05 NOTE — Telephone Encounter (Signed)
Encounter complete. 

## 2015-09-06 ENCOUNTER — Telehealth (HOSPITAL_COMMUNITY): Payer: Self-pay

## 2015-09-06 NOTE — Telephone Encounter (Signed)
Encounter complete. 

## 2015-09-07 ENCOUNTER — Ambulatory Visit (HOSPITAL_COMMUNITY)
Admission: RE | Admit: 2015-09-07 | Payer: 59 | Source: Ambulatory Visit | Attending: Cardiovascular Disease | Admitting: Cardiovascular Disease

## 2015-10-02 ENCOUNTER — Telehealth (HOSPITAL_COMMUNITY): Payer: Self-pay

## 2015-10-02 NOTE — Telephone Encounter (Signed)
Encounter complete. 

## 2015-10-03 ENCOUNTER — Telehealth (HOSPITAL_COMMUNITY): Payer: Self-pay

## 2015-10-03 NOTE — Telephone Encounter (Signed)
Encounter complete. 

## 2015-10-04 ENCOUNTER — Ambulatory Visit (HOSPITAL_COMMUNITY)
Admission: RE | Admit: 2015-10-04 | Discharge: 2015-10-04 | Disposition: A | Discharge status: Home / Self Care | Payer: BLUE CROSS/BLUE SHIELD | Source: Ambulatory Visit | Attending: Cardiovascular Disease | Admitting: Cardiovascular Disease

## 2015-10-04 DIAGNOSIS — Z79899 Other long term (current) drug therapy: Secondary | ICD-10-CM | POA: Diagnosis not present

## 2015-10-04 DIAGNOSIS — R600 Localized edema: Secondary | ICD-10-CM

## 2015-10-04 DIAGNOSIS — Z13220 Encounter for screening for lipoid disorders: Secondary | ICD-10-CM

## 2015-10-04 DIAGNOSIS — R0609 Other forms of dyspnea: Secondary | ICD-10-CM | POA: Diagnosis not present

## 2015-10-04 DIAGNOSIS — R0600 Dyspnea, unspecified: Secondary | ICD-10-CM

## 2015-10-05 LAB — EXERCISE TOLERANCE TEST
CHL RATE OF PERCEIVED EXERTION: 17
CSEPEW: 7 METS
CSEPHR: 89 %
Exercise duration (min): 5 min
Exercise duration (sec): 0 s
MPHR: 167 {beats}/min
Peak HR: 150 {beats}/min
Rest HR: 77 {beats}/min

## 2015-10-08 ENCOUNTER — Telehealth: Payer: Self-pay | Admitting: *Deleted

## 2015-10-08 NOTE — Telephone Encounter (Signed)
-----   Message from Skeet Latch, MD sent at 10/07/2015  4:16 PM EST ----- Normal stress test.  It did, however, show that she is out of shape.  Increase physical activity to at least 30 minutes most days of the week.

## 2015-10-08 NOTE — Telephone Encounter (Signed)
Spoke to patient. Result given . Verbalized understanding  

## 2015-11-05 ENCOUNTER — Encounter: Payer: Self-pay | Admitting: Internal Medicine

## 2015-11-05 ENCOUNTER — Ambulatory Visit (INDEPENDENT_AMBULATORY_CARE_PROVIDER_SITE_OTHER): Payer: BLUE CROSS/BLUE SHIELD | Admitting: Internal Medicine

## 2015-11-05 VITALS — BP 130/80 | HR 66 | Temp 98.3°F | Resp 18 | Ht 60.0 in | Wt 202.0 lb

## 2015-11-05 DIAGNOSIS — E7800 Pure hypercholesterolemia, unspecified: Secondary | ICD-10-CM

## 2015-11-05 DIAGNOSIS — M25521 Pain in right elbow: Secondary | ICD-10-CM | POA: Diagnosis not present

## 2015-11-05 DIAGNOSIS — E669 Obesity, unspecified: Secondary | ICD-10-CM

## 2015-11-05 MED ORDER — CYCLOBENZAPRINE HCL 5 MG PO TABS: 5.0000 mg | ORAL_TABLET | Freq: Three times a day (TID) | ORAL | Status: DC | PRN

## 2015-11-05 NOTE — Progress Notes (Signed)
Pre visit review using our clinic review tool, if applicable. No additional management support is needed unless otherwise documented below in the visit note. 

## 2015-11-05 NOTE — Patient Instructions (Signed)
We will have you schedule with Dr. Tamala Julian so he can look at the elbow for the tendon inflammation.   We have also given you a muscle relaxer that might help in the meantime with your other medicines called flexeril which you can use up to 3 times per day as needed.   Lateral Epicondylitis With Rehab Lateral epicondylitis involves inflammation and pain around the outer portion of the elbow. The pain is caused by inflammation of the tendons in the forearm that bring back (extend) the wrist. Lateral epicondylitis is also called tennis elbow, because it is very common in tennis players. However, it may occur in any individual who extends the wrist repetitively. If lateral epicondylitis is left untreated, it may become a chronic problem. SYMPTOMS   Pain, tenderness, and inflammation on the outer (lateral) side of the elbow.  Pain or weakness with gripping activities.  Pain that increases with wrist-twisting motions (playing tennis, using a screwdriver, opening a door or a jar).  Pain with lifting objects, including a coffee cup. CAUSES  Lateral epicondylitis is caused by inflammation of the tendons that extend the wrist. Causes of injury may include:  Repetitive stress and strain on the muscles and tendons that extend the wrist.  Sudden change in activity level or intensity.  Incorrect grip in racquet sports.  Incorrect grip size of racquet (often too large).  Incorrect hitting position or technique (usually backhand, leading with the elbow).  Using a racket that is too heavy. RISK INCREASES WITH:  Sports or occupations that require repetitive and/or strenuous forearm and wrist movements (tennis, squash, racquetball, carpentry).  Poor wrist and forearm strength and flexibility.  Failure to warm up properly before activity.  Resuming activity before healing, rehabilitation, and conditioning are complete. PREVENTION   Warm up and stretch properly before activity.  Maintain physical  fitness:  Strength, flexibility, and endurance.  Cardiovascular fitness.  Wear and use properly fitted equipment.  Learn and use proper technique and have a coach correct improper technique.  Wear a tennis elbow (counterforce) brace. PROGNOSIS  The course of this condition depends on the degree of the injury. If treated properly, acute cases (symptoms lasting less than 4 weeks) are often resolved in 2 to 6 weeks. Chronic (longer lasting cases) often resolve in 3 to 6 months but may require physical therapy. RELATED COMPLICATIONS   Frequently recurring symptoms, resulting in a chronic problem. Properly treating the problem the first time decreases frequency of recurrence.  Chronic inflammation, scarring tendon degeneration, and partial tendon tear, requiring surgery.  Delayed healing or resolution of symptoms. TREATMENT  Treatment first involves the use of ice and medicine to reduce pain and inflammation. Strengthening and stretching exercises may help reduce discomfort if performed regularly. These exercises may be performed at home if the condition is an acute injury. Chronic cases may require a referral to a physical therapist for evaluation and treatment. Your caregiver may advise a corticosteroid injection to help reduce inflammation. Rarely, surgery is needed. MEDICATION  If pain medicine is needed, nonsteroidal anti-inflammatory medicines (aspirin and ibuprofen), or other minor pain relievers (acetaminophen), are often advised.  Do not take pain medicine for 7 days before surgery.  Prescription pain relievers may be given, if your caregiver thinks they are needed. Use only as directed and only as much as you need.  Corticosteroid injections may be recommended. These injections should be reserved only for the most severe cases, because they can only be given a certain number of times. HEAT AND  COLD  Cold treatment (icing) should be applied for 10 to 15 minutes every 2 to 3 hours  for inflammation and pain, and immediately after activity that aggravates your symptoms. Use ice packs or an ice massage.  Heat treatment may be used before performing stretching and strengthening activities prescribed by your caregiver, physical therapist, or athletic trainer. Use a heat pack or a warm water soak. SEEK MEDICAL CARE IF: Symptoms get worse or do not improve in 2 weeks, despite treatment. EXERCISES  RANGE OF MOTION (ROM) AND STRETCHING EXERCISES - Epicondylitis, Lateral (Tennis Elbow) These exercises may help you when beginning to rehabilitate your injury. Your symptoms may go away with or without further involvement from your physician, physical therapist, or athletic trainer. While completing these exercises, remember:   Restoring tissue flexibility helps normal motion to return to the joints. This allows healthier, less painful movement and activity.  An effective stretch should be held for at least 30 seconds.  A stretch should never be painful. You should only feel a gentle lengthening or release in the stretched tissue. RANGE OF MOTION - Wrist Flexion, Active-Assisted  Extend your right / left elbow with your fingers pointing down.*  Gently pull the back of your hand towards you, until you feel a gentle stretch on the top of your forearm.  Hold this position for __________ seconds. Repeat __________ times. Complete this exercise __________ times per day.  *If directed by your physician, physical therapist or athletic trainer, complete this stretch with your elbow bent, rather than extended. RANGE OF MOTION - Wrist Extension, Active-Assisted  Extend your right / left elbow and turn your palm upwards.*  Gently pull your palm and fingertips back, so your wrist extends and your fingers point more toward the ground.  You should feel a gentle stretch on the inside of your forearm.  Hold this position for __________ seconds. Repeat __________ times. Complete this  exercise __________ times per day. *If directed by your physician, physical therapist or athletic trainer, complete this stretch with your elbow bent, rather than extended. STRETCH - Wrist Flexion  Place the back of your right / left hand on a tabletop, leaving your elbow slightly bent. Your fingers should point away from your body.  Gently press the back of your hand down onto the table by straightening your elbow. You should feel a stretch on the top of your forearm.  Hold this position for __________ seconds. Repeat __________ times. Complete this stretch __________ times per day.  STRETCH - Wrist Extension   Place your right / left fingertips on a tabletop, leaving your elbow slightly bent. Your fingers should point backwards.  Gently press your fingers and palm down onto the table by straightening your elbow. You should feel a stretch on the inside of your forearm.  Hold this position for __________ seconds. Repeat __________ times. Complete this stretch __________ times per day.  STRENGTHENING EXERCISES - Epicondylitis, Lateral (Tennis Elbow) These exercises may help you when beginning to rehabilitate your injury. They may resolve your symptoms with or without further involvement from your physician, physical therapist, or athletic trainer. While completing these exercises, remember:   Muscles can gain both the endurance and the strength needed for everyday activities through controlled exercises.  Complete these exercises as instructed by your physician, physical therapist or athletic trainer. Increase the resistance and repetitions only as guided.  You may experience muscle soreness or fatigue, but the pain or discomfort you are trying to eliminate should never worsen  during these exercises. If this pain does get worse, stop and make sure you are following the directions exactly. If the pain is still present after adjustments, discontinue the exercise until you can discuss the  trouble with your caregiver. STRENGTH - Wrist Flexors  Sit with your right / left forearm palm-up and fully supported on a table or countertop. Your elbow should be resting below the height of your shoulder. Allow your wrist to extend over the edge of the surface.  Loosely holding a __________ weight, or a piece of rubber exercise band or tubing, slowly curl your hand up toward your forearm.  Hold this position for __________ seconds. Slowly lower the wrist back to the starting position in a controlled manner. Repeat __________ times. Complete this exercise __________ times per day.  STRENGTH - Wrist Extensors  Sit with your right / left forearm palm-down and fully supported on a table or countertop. Your elbow should be resting below the height of your shoulder. Allow your wrist to extend over the edge of the surface.  Loosely holding a __________ weight, or a piece of rubber exercise band or tubing, slowly curl your hand up toward your forearm.  Hold this position for __________ seconds. Slowly lower the wrist back to the starting position in a controlled manner. Repeat __________ times. Complete this exercise __________ times per day.  STRENGTH - Ulnar Deviators  Stand with a ____________________ weight in your right / left hand, or sit while holding a rubber exercise band or tubing, with your healthy arm supported on a table or countertop.  Move your wrist, so that your pinkie travels toward your forearm and your thumb moves away from your forearm.  Hold this position for __________ seconds and then slowly lower the wrist back to the starting position. Repeat __________ times. Complete this exercise __________ times per day STRENGTH - Radial Deviators  Stand with a ____________________ weight in your right / left hand, or sit while holding a rubber exercise band or tubing, with your injured arm supported on a table or countertop.  Raise your hand upward in front of you or pull up on  the rubber tubing.  Hold this position for __________ seconds and then slowly lower the wrist back to the starting position. Repeat __________ times. Complete this exercise __________ times per day. STRENGTH - Forearm Supinators   Sit with your right / left forearm supported on a table, keeping your elbow below shoulder height. Rest your hand over the edge, palm down.  Gently grip a hammer or a soup ladle.  Without moving your elbow, slowly turn your palm and hand upward to a "thumbs-up" position.  Hold this position for __________ seconds. Slowly return to the starting position. Repeat __________ times. Complete this exercise __________ times per day.  STRENGTH - Forearm Pronators   Sit with your right / left forearm supported on a table, keeping your elbow below shoulder height. Rest your hand over the edge, palm up.  Gently grip a hammer or a soup ladle.  Without moving your elbow, slowly turn your palm and hand upward to a "thumbs-up" position.  Hold this position for __________ seconds. Slowly return to the starting position. Repeat __________ times. Complete this exercise __________ times per day.  STRENGTH - Grip  Grasp a tennis ball, a dense sponge, or a large, rolled sock in your hand.  Squeeze as hard as you can, without increasing any pain.  Hold this position for __________ seconds. Release your grip slowly.  Repeat __________ times. Complete this exercise __________ times per day.  STRENGTH - Elbow Extensors, Isometric  Stand or sit upright, on a firm surface. Place your right / left arm so that your palm faces your stomach, and it is at the height of your waist.  Place your opposite hand on the underside of your forearm. Gently push up as your right / left arm resists. Push as hard as you can with both arms, without causing any pain or movement at your right / left elbow. Hold this stationary position for __________ seconds. Gradually release the tension in both  arms. Allow your muscles to relax completely before repeating.   This information is not intended to replace advice given to you by your health care provider. Make sure you discuss any questions you have with your health care provider.   Document Released: 09/15/2005 Document Revised: 10/06/2014 Document Reviewed: 12/28/2008 Elsevier Interactive Patient Education [employer].

## 2015-11-08 ENCOUNTER — Encounter: Payer: Self-pay | Admitting: Internal Medicine

## 2015-11-08 DIAGNOSIS — M25521 Pain in right elbow: Secondary | ICD-10-CM | POA: Insufficient documentation

## 2015-11-08 HISTORY — DX: Pain in right elbow: M25.521

## 2015-11-08 NOTE — Assessment & Plan Note (Signed)
Recent labs with high cholesterol and she was not advised to try anything. She wants to work with diet and exercise until physical in the fall.

## 2015-11-08 NOTE — Assessment & Plan Note (Signed)
Bordering on morbid obesity and talked to her about the long term consequences of high weight.

## 2015-11-08 NOTE — Assessment & Plan Note (Signed)
Suspect tennis elbow from overuse at her job. Will refer to sports medicine for ultrasound guided therapy. Rx for flexeril for the pain and continue taking the mobic. Given stretching and icing regimen to her to try in the meantime.

## 2015-11-08 NOTE — Progress Notes (Signed)
   Subjective:    Patient ID: Jill Shah, female    DOB: [birth year], 55 y.o.   MRN: [id number]  HPI The patient is a new 55 YO female coming in for acute right elbow pain. It is going on for several months. She has seen her previous MD about this and he gave her a solution which has not helped. She is also taking mobic which helps a little bit. She was given hydrocodone/ibuprofen which helps some but she does not like taking it often. She denies injury but uses that arm a lot at her job. It is painful to even lift anything with her right hand (dominant hand). This is limiting her ability to do things at work and home. Pain 8/10. Not improving since then. Pain radiates into the hand but not up into the shoulder.   PMH, [facility], social history reviewed and updated.   Review of Systems  Constitutional: Positive for activity change. Negative for fever, chills, appetite change, fatigue and unexpected weight change.  HENT: Negative.   Eyes: Negative.   Respiratory: Negative for cough, chest tightness, shortness of breath and wheezing.   Cardiovascular: Negative for chest pain, palpitations and leg swelling.  Gastrointestinal: Negative for nausea, abdominal pain, diarrhea, constipation and abdominal distention.  Musculoskeletal: Positive for myalgias and arthralgias. Negative for back pain.  Skin: Negative.   Neurological: Negative.   Psychiatric/Behavioral: Negative.       Objective:   Physical Exam  Constitutional: She is oriented to person, place, and time. She appears well-developed and well-nourished.  HENT:  Head: Normocephalic and atraumatic.  Eyes: EOM are normal.  Neck: Normal range of motion.  Cardiovascular: Normal rate and regular rhythm.   Pulmonary/Chest: Effort normal and breath sounds normal. No respiratory distress. She has no wheezes.  Abdominal: Soft. Bowel sounds are normal. She exhibits no distension. There is no tenderness.  Musculoskeletal: She exhibits  tenderness.  Pain lateral elbow and radiates into the hand.   Neurological: She is alert and oriented to person, place, and time. Coordination normal.  Skin: Skin is warm and dry.  Psychiatric: She has a normal mood and affect.   Filed Vitals:   11/05/15 1400  BP: 130/80  Pulse: 66  Temp: 98.3 F (36.8 C)  TempSrc: Oral  Resp: 18  Height: 5' (1.524 m)  Weight: 202 lb (91.627 kg)  SpO2: 98%      Assessment & Plan:

## 2015-11-19 ENCOUNTER — Ambulatory Visit (INDEPENDENT_AMBULATORY_CARE_PROVIDER_SITE_OTHER): Payer: BLUE CROSS/BLUE SHIELD | Admitting: Family Medicine

## 2015-11-19 ENCOUNTER — Encounter: Payer: Self-pay | Admitting: Family Medicine

## 2015-11-19 ENCOUNTER — Other Ambulatory Visit (INDEPENDENT_AMBULATORY_CARE_PROVIDER_SITE_OTHER): Payer: BLUE CROSS/BLUE SHIELD

## 2015-11-19 VITALS — BP 142/98 | HR 79 | Ht 60.0 in | Wt 203.0 lb

## 2015-11-19 DIAGNOSIS — M7711 Lateral epicondylitis, right elbow: Secondary | ICD-10-CM

## 2015-11-19 DIAGNOSIS — M25521 Pain in right elbow: Secondary | ICD-10-CM

## 2015-11-19 DIAGNOSIS — G5631 Lesion of radial nerve, right upper limb: Secondary | ICD-10-CM

## 2015-11-19 HISTORY — DX: Lateral epicondylitis, right elbow: M77.11

## 2015-11-19 MED ORDER — GABAPENTIN 100 MG PO CAPS: 100.0000 mg | ORAL_CAPSULE | Freq: Every day | ORAL | Status: DC

## 2015-11-19 NOTE — Assessment & Plan Note (Signed)
Given an injection today, encourage patient to do the gabapentin nightly, continue topical anti-inflammatories but stopped oral anti-inflammatories secondary to easy bruising. Return to clinic in 4 weeks.

## 2015-11-19 NOTE — Assessment & Plan Note (Signed)
Mild with tearing of the common extensor tendon. This is causing some compression on the posterior interosseous nerve. Injected the nerve today. Discussed and immobilization, home exercises, icing protocol. Patients will do more topical anti-inflammatory given gabapentin to help with the posterior interosseous nerve. Patient will come back and see me again in 4 weeks.

## 2015-11-19 NOTE — Progress Notes (Signed)
[name] [address] [place], [place] [address] Phone: [phone] Subjective:    I'm seeing this patient by the request  of:  Hoyt Koch, MD   CC: Right elbow pain   [id number] Jill Shah is a 55 y.o. female coming in with complaint of right elbow pain. Been going on multiple weeks. Does not remember any true injury. Describes the pain as a dull, throbbing aching sensation. Seems to be radiating towards her hand more and sometimes seems to radiate up past her elbow. States that if she lists something the wrong way she can of a severe pain and she has noticed some mild weakness. No nighttime awakening. Rates the severity of pain a 6 out of 10. Has tried heat and ice and was given a prescription for meloxicam. States that it is helpful but it does cause bruising.     Past Medical History  Diagnosis Date  . Hypertension   . Hypercholesterolemia    Past Surgical History  Procedure Laterality Date  . Fibroid ablation    . Cesarean section      x2  . Tubal ligation     Social History   Social History  . Marital Status: Married    Spouse Name: N/A  . Number of Children: N/A  . Years of Education: N/A   Social History Main Topics  . Smoking status: Never Smoker   . Smokeless tobacco: Never Used  . Alcohol Use: No  . Drug Use: No  . Sexual Activity: Not Asked   Other Topics Concern  . None   Social History Narrative   Allergies  Allergen Reactions  . Hydrocodone    Family History  Problem Relation Age of Onset  . Colon cancer Neg Hx   . Colon polyps Neg Hx   . Diabetes Mother   . Lung cancer Mother   . Pancreatic cancer Sister   . Other Brother     aids    Past medical history, social, surgical and family history all reviewed in electronic medical record.  No pertanent information unless stated regarding to the chief complaint.   Review of Systems: No headache, visual changes, nausea, vomiting,  diarrhea, constipation, dizziness, abdominal pain, skin rash, fevers, chills, night sweats, weight loss, swollen lymph nodes, body aches, joint swelling, muscle aches, chest pain, shortness of breath, mood changes.   Objective Blood pressure 142/98, pulse 79, height 5' (1.524 m), weight 203 lb (92.08 kg), SpO2 98 %.  General: No apparent distress alert and oriented x3 mood and affect normal, dressed appropriately.  HEENT: Pupils equal, extraocular movements intact  Respiratory: Patient's speak in full sentences and does not appear short of breath  Cardiovascular: No lower extremity edema, non tender, no erythema  Skin: Warm dry intact with no signs of infection or rash on extremities or on axial skeleton.  Abdomen: Soft nontender  Neuro: Cranial nerves II through XII are intact, neurovascularly intact in all extremities with 2+ DTRs and 2+ pulses.  Lymph: No lymphadenopathy of posterior or anterior cervical chain or axillae bilaterally.  Gait normal with good balance and coordination.  MSK:  Non tender with full range of motion and good stability and symmetric strength and tone of shoulders,  wrist, hip, knee and ankles bilaterally.  Elbow: Right Unremarkable to inspection. Range of motion full pronation, supination, flexion, extension. Strength is full to all of the above directions Stable to varus, valgus stress. Negative moving valgus stress test. Tender  over the belly of the common extensor muscle and mildly over the lateral epicondylar region pain with resisted extension. Ulnar nerve does not sublux. Negative cubital tunnel Tinel's.  Musculoskeletal ultrasound was performed and interpreted by Charlann Boxer D.O.   Elbow:  Lateral epicondyle and common extensor tendon origin visualized.  Patient does have some intersubstance tearing of the common extensor tendon. Seems to be intact with increasing Doppler flow and hypoechoic changes.  Posterior interosseous nerve though proximally 4 cm  from lateral epicondylar region and does have significant hypoechoic changes as well as increasing Doppler flow. Very tender to palpation..  Radial head unremarkable and located in annular ligament Medial epicondyle and common flexor tendon origin visualized.  No edema, effusions, or avulsions seen. Ulnar nerve in cubital tunnel unremarkable. Olecranon and triceps insertion visualized and unremarkable without edema, effusion, or avulsion.  No signs olecranon bursitis. Power doppler signal normal.  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  Verbal informed consent obtained.  Time-out conducted.  Noted no overlying erythema, induration, or other signs of local infection.  Skin prepped in a sterile fashion.  Local anesthesia: Topical Ethyl chloride.  With sterile technique and under real time ultrasound guidance:  After verbal consent patient was prepped with alcohol swabs and with a 25-gauge 1 inch needle was injected with a total of 0.5 mL of 0.5% Marcaine and 0.5 mL of Kenalog 40 mg/dL into the neuronal sheath. Completed without difficulty  Pain immediately resolved suggesting accurate placement of the medication.  Advised to call if fevers/chills, erythema, induration, drainage, or persistent bleeding.  Images permanently stored and available for review in the ultrasound unit.  Impression: Technically successful ultrasound guided injection.  Procedure note [id number]; 15 minutes spent for Therapeutic exercises as stated in above notes.  This included exercises focusing on stretching, strengthening,  with significant focus on eccentric aspects.  Eccentric exercises working on flexion and extension given. Supination and pronation exercises given as well. Proper technique shown and discussed handout in great detail with ATC.  All questions were discussed and answered.     Impression and Recommendations:     This case required medical decision making of moderate complexity.      Note: This dictation was prepared with Dragon dictation along with smaller phrase technology. Any transcriptional errors that result from this process are unintentional.

## 2015-11-19 NOTE — Progress Notes (Signed)
Pre visit review using our clinic review tool, if applicable. No additional management support is needed unless otherwise documented below in the visit note. 

## 2015-11-19 NOTE — Patient Instructions (Signed)
Good to see you.  Ice 20 minutes 2 times daily. Usually after activity and before bed. Exercises 3 times a week.  Avoid any lifting with palms down.  Only lift with thumbs up or palms up.  pennsaid pinkie amount topically 2 times daily as needed.  Wear brace day and night for 1 week then nightly for 2 weeks We injected the nerve today  Gabapentin 100mg  at night will help the nerve pain  See me again in 3-4 weeks.

## 2015-12-10 ENCOUNTER — Other Ambulatory Visit (INDEPENDENT_AMBULATORY_CARE_PROVIDER_SITE_OTHER): Payer: BLUE CROSS/BLUE SHIELD

## 2015-12-10 ENCOUNTER — Encounter: Payer: Self-pay | Admitting: Family Medicine

## 2015-12-10 ENCOUNTER — Ambulatory Visit (INDEPENDENT_AMBULATORY_CARE_PROVIDER_SITE_OTHER): Payer: BLUE CROSS/BLUE SHIELD | Admitting: Family Medicine

## 2015-12-10 VITALS — BP 130/82 | HR 66 | Ht 60.0 in | Wt 206.0 lb

## 2015-12-10 DIAGNOSIS — M7711 Lateral epicondylitis, right elbow: Secondary | ICD-10-CM

## 2015-12-10 DIAGNOSIS — G5631 Lesion of radial nerve, right upper limb: Secondary | ICD-10-CM | POA: Diagnosis not present

## 2015-12-10 DIAGNOSIS — M25521 Pain in right elbow: Secondary | ICD-10-CM | POA: Diagnosis not present

## 2015-12-10 NOTE — Progress Notes (Signed)
Pre visit review using our clinic review tool, if applicable. No additional management support is needed unless otherwise documented below in the visit note. 

## 2015-12-10 NOTE — Assessment & Plan Note (Signed)
Patient's common extensor tendon tear seems to be healing. Approximate 75% better. Still has some hypoechoic changes. We discussed icing as well as eccentric exercises. Patient declined formal physical therapy. Patient come back and see me again in 6 weeks to make sure that it finishes healing. Exercise prescription given.  Spent  25 minutes with patient face-to-face and had greater than 50% of counseling including as described above in assessment and plan.

## 2015-12-10 NOTE — Assessment & Plan Note (Signed)
Impression and improved after injection. Patient tolerated the procedure well. Continues the gabapentin at night.

## 2015-12-10 NOTE — Progress Notes (Signed)
[facility] [address], [place] [address] Phone: [phone] Subjective:    I'm seeing this patient by the request  of:  Hoyt Koch, MD   CC: Right elbow pain f/u   [id number] Jill Shah is a 55 y.o. female coming in with complaint of right elbow pain. Patient was seen previously and did have a common extensor tendon tear as well as a posterior interosseous nerve entrapment. Patient was given an injection of the nerve that she states 50% immediately. Has been doing the exercises as well as the bracing. States that she has another 25% better. Was doing very well until 2 weeks ago when she tried to lift something and it caused re-exacerbation. Some mild darkening of the skin she notices in the morning. No radiation of pain and no numbness and denies any weakness. Been wearing the brace and doing the exercises fairly regularly. Has responded well to the gabapentin.    Past Medical History  Diagnosis Date  . Hypertension   . Hypercholesterolemia    Past Surgical History  Procedure Laterality Date  . Fibroid ablation    . Cesarean section      x2  . Tubal ligation     Social History   Social History  . Marital Status: Married    Spouse Name: N/A  . Number of Children: N/A  . Years of Education: N/A   Social History Main Topics  . Smoking status: Never Smoker   . Smokeless tobacco: Never Used  . Alcohol Use: No  . Drug Use: No  . Sexual Activity: Not Asked   Other Topics Concern  . None   Social History Narrative   Allergies  Allergen Reactions  . Hydrocodone    Family History  Problem Relation Age of Onset  . Colon cancer Neg Hx   . Colon polyps Neg Hx   . Diabetes Mother   . Lung cancer Mother   . Pancreatic cancer Sister   . Other Brother     aids    Past medical history, social, surgical and family history all reviewed in electronic medical record.  No pertanent information unless stated  regarding to the chief complaint.   Review of Systems: No headache, visual changes, nausea, vomiting, diarrhea, constipation, dizziness, abdominal pain, skin rash, fevers, chills, night sweats, weight loss, swollen lymph nodes, body aches, joint swelling, muscle aches, chest pain, shortness of breath, mood changes.   Objective Blood pressure 130/82, pulse 66, height 5' (1.524 m), weight 206 lb (93.441 kg), SpO2 94 %.  General: No apparent distress alert and oriented x3 mood and affect normal, dressed appropriately.  HEENT: Pupils equal, extraocular movements intact  Respiratory: Patient's speak in full sentences and does not appear short of breath  Cardiovascular: No lower extremity edema, non tender, no erythema  Skin: Warm dry intact with no signs of infection or rash on extremities or on axial skeleton.  Abdomen: Soft nontender  Neuro: Cranial nerves II through XII are intact, neurovascularly intact in all extremities with 2+ DTRs and 2+ pulses.  Lymph: No lymphadenopathy of posterior or anterior cervical chain or axillae bilaterally.  Gait normal with good balance and coordination.  MSK:  Non tender with full range of motion and good stability and symmetric strength and tone of shoulders,  wrist, hip, knee and ankles bilaterally.  Elbow: Right Unremarkable to inspection. Range of motion full pronation, supination, flexion, extension. Strength is full to all of the above  directions Stable to varus, valgus stress. Negative moving valgus stress test. Mild tenderness to palpation over the lateral epicondylar region. Ulnar nerve does not sublux. Negative cubital tunnel Tinel's.  Musculoskeletal ultrasound was performed and interpreted by Charlann Boxer D.O.   Elbow:  Lateral epicondyle and common extensor tendon origin visualized.  Common extensor tendon does not have any intersubstance tearing. Very mild tearing at the insertion. Mild hypoechoic changes still noted.   Posterior interosseous  nerve is normal with no hypoechoic changes but nontender.  Radial head unremarkable and located in annular ligament Medial epicondyle and common flexor tendon origin visualized.  No edema, effusions, or avulsions seen. Ulnar nerve in cubital tunnel unremarkable. Olecranon and triceps insertion visualized and unremarkable without edema, effusion, or avulsion.  No signs olecranon bursitis. Power doppler signal normal.  IMPRESSION: Healing common extensor tear with no retraction        Impression and Recommendations:     This case required medical decision making of moderate complexity.      Note: This dictation was prepared with Dragon dictation along with smaller phrase technology. Any transcriptional errors that result from this process are unintentional.

## 2015-12-10 NOTE — Patient Instructions (Signed)
Good to see you  You are making progress.  Try to lift with thumbs up or underhand ONLY  Wear brace nightly for another 10 nights then as needed.  I am impressed.  See me again in 6 weeks.

## 2015-12-21 ENCOUNTER — Telehealth: Payer: Self-pay | Admitting: Family Medicine

## 2015-12-21 ENCOUNTER — Telehealth: Payer: Self-pay | Admitting: Internal Medicine

## 2015-12-21 NOTE — Telephone Encounter (Signed)
atient Name: Jill Shah  DOB: 06-Aug-1961    Initial Comment Caller States having allergic reaction to a med. has bad rash that is peeling on arm where she put the med.    Nurse Assessment  Nurse: Raphael Gibney, RN, Vanita Ingles Date/Time (Eastern Time): 12/21/2015 8:59:45 AM  Confirm and document reason for call. If symptomatic, describe symptoms. You must click the next button to save text entered. ---Caller states she was prescribed [place] cream for pain in her arm. Stopped using cream a few days ago. Her right arm is swollen, has a rash and skin is peeling. Has swelling in her mouth in the back of her mouth where her gums are. No SOB and she can swallow.  Has the patient traveled out of the country within the last 30 days? ---Not Applicable  Does the patient have any new or worsening symptoms? ---Yes  Will a triage be completed? ---Yes  Related visit to physician within the last 2 weeks? ---No  Does the PT have any chronic conditions? (i.e. diabetes, asthma, etc.) ---Yes  List chronic conditions. ---arthritis; tendonitis  Is the patient pregnant or possibly pregnant? (Ask all females between the ages of 20-55) ---No  Is this a behavioral health or substance abuse call? ---No     Guidelines    Guideline Title Affirmed Question Affirmed Notes  Rash or Redness - Localized [1] Applying cream or ointment AND [2] causes severe itch, burning or pain    Final Disposition User   Call PCP within 24 Hours Stringer, RN, Vera    Comments  No appts available today at [facility]; pt does not want to go to urgent care or another [place] office. She would like appt on Monday. Please call pt back.   Referrals  GO TO FACILITY REFUSED   Disagree/Comply: Disagree  Disagree/Comply Reason: Disagree with instructions

## 2015-12-21 NOTE — Telephone Encounter (Signed)
Pt is currently being seen at [facility].

## 2015-12-21 NOTE — Telephone Encounter (Signed)
Recommend Saturday clinic visit.

## 2015-12-21 NOTE — Telephone Encounter (Signed)
Patient is requesting call back in regards to allergic reaction.  Will send over to [facility] as well.

## 2015-12-21 NOTE — Telephone Encounter (Signed)
lmovm

## 2015-12-27 ENCOUNTER — Telehealth: Payer: Self-pay | Admitting: Family Medicine

## 2015-12-27 NOTE — Telephone Encounter (Signed)
Pt called back and she is wondering if there is anything she can take for the dizziness and nausea

## 2015-12-27 NOTE — Telephone Encounter (Signed)
Spoke with pt, she stated that she has not taken the flexeril. She stated the only thing she has been taking is the gabapentin. Advised pt to stop taking gabapentin. She asked if there was anything else to take because the gabapentin was helping. Per dr Tamala Julian, try the flexeril to see if this helps. Pt made aware.

## 2015-12-27 NOTE — Telephone Encounter (Signed)
Discussed with pt

## 2015-12-27 NOTE — Telephone Encounter (Signed)
Pt states she is feeling dizzy and when she stands up her head starts spinning. She is wondering if it could be the medicine. Please give her a call back.

## 2015-12-27 NOTE — Telephone Encounter (Signed)
If anything would be the muscle relaxer  Would stop it an dsee if better. Give it 3 days If not then stop the gabapentin as well.

## 2015-12-27 NOTE — Telephone Encounter (Signed)
Not really  Can try over the counter meclizine  Benedryl can help sometimes If worsening need to go be seen.

## 2016-01-21 ENCOUNTER — Ambulatory Visit (INDEPENDENT_AMBULATORY_CARE_PROVIDER_SITE_OTHER): Payer: BLUE CROSS/BLUE SHIELD | Admitting: Family Medicine

## 2016-01-21 ENCOUNTER — Encounter: Payer: Self-pay | Admitting: Family Medicine

## 2016-01-21 VITALS — BP 138/84 | HR 74 | Ht 60.0 in | Wt 205.0 lb

## 2016-01-21 DIAGNOSIS — M7711 Lateral epicondylitis, right elbow: Secondary | ICD-10-CM

## 2016-01-21 NOTE — Progress Notes (Signed)
Pre visit review using our clinic review tool, if applicable. No additional management support is needed unless otherwise documented below in the visit note. 

## 2016-01-21 NOTE — Progress Notes (Signed)
[facility] [address] [place], [place] [address] Phone: [phone] Subjective:    I'm seeing this patient by the request  of:  Hoyt Koch, MD   CC: Right elbow pain f/u   [id number] Jill Shah is a 55 y.o. female coming in with complaint of right elbow pain. Patient was seen previously and did have a common extensor tendon tear as well as a posterior interosseous nerve entrapment.  Patient though states that she is feeling very good. Continues to monitor the way she lifts things. Avoiding certain activities. Has been increasing her activity ago doing things did not have any worsening pain. No numbness or weakness noted. Overall happy with the results. Would state that she is 85-90% tear.    Past Medical History  Diagnosis Date  . Hypertension   . Hypercholesterolemia    Past Surgical History  Procedure Laterality Date  . Fibroid ablation    . Cesarean section      x2  . Tubal ligation     Social History   Social History  . Marital Status: Married    Spouse Name: N/A  . Number of Children: N/A  . Years of Education: N/A   Social History Main Topics  . Smoking status: Never Smoker   . Smokeless tobacco: Never Used  . Alcohol Use: No  . Drug Use: No  . Sexual Activity: Not Asked   Other Topics Concern  . None   Social History Narrative   Allergies  Allergen Reactions  . Hydrocodone    Family History  Problem Relation Age of Onset  . Colon cancer Neg Hx   . Colon polyps Neg Hx   . Diabetes Mother   . Lung cancer Mother   . Pancreatic cancer Sister   . Other Brother     aids    Past medical history, social, surgical and family history all reviewed in electronic medical record.  No pertanent information unless stated regarding to the chief complaint.   Review of Systems: No headache, visual changes, nausea, vomiting, diarrhea, constipation, dizziness, abdominal pain, skin rash, fevers, chills, night  sweats, weight loss, swollen lymph nodes, body aches, joint swelling, muscle aches, chest pain, shortness of breath, mood changes.   Objective Blood pressure 138/84, pulse 74, height 5' (1.524 m), weight 205 lb (92.987 kg), SpO2 97 %.  General: No apparent distress alert and oriented x3 mood and affect normal, dressed appropriately.  HEENT: Pupils equal, extraocular movements intact  Respiratory: Patient's speak in full sentences and does not appear short of breath  Cardiovascular: No lower extremity edema, non tender, no erythema  Skin: Warm dry intact with no signs of infection or rash on extremities or on axial skeleton.  Abdomen: Soft nontender  Neuro: Cranial nerves II through XII are intact, neurovascularly intact in all extremities with 2+ DTRs and 2+ pulses.  Lymph: No lymphadenopathy of posterior or anterior cervical chain or axillae bilaterally.  Gait normal with good balance and coordination.  MSK:  Non tender with full range of motion and good stability and symmetric strength and tone of shoulders,  wrist, hip, knee and ankles bilaterally.  Elbow: Right Unremarkable to inspection. Range of motion full pronation, supination, flexion, extension. Strength is full to all of the above directions Stable to varus, valgus stress. Negative moving valgus stress test. Very mild discomfort over the lateral epicondylar region. Ulnar nerve does not sublux. Negative cubital tunnel Tinel's.  Musculoskeletal ultrasound was performed and  interpreted by Charlann Boxer D.O.   Elbow:  Lateral epicondyle and common extensor tendon origin visualized.  Common extensor tendon show significant scar tissue formation with less than 5% of tendon still needing to heal. Considerable increase in Doppler flow  Posterior interosseous nerve is normal .  Radial head unremarkable and located in annular ligament Medial epicondyle and common flexor tendon origin visualized.  No edema, effusions, or avulsions seen.  Ulnar nerve in cubital tunnel unremarkable. Olecranon and triceps insertion visualized and unremarkable without edema, effusion, or avulsion.  No signs olecranon bursitis. Power doppler signal normal.  IMPRESSION: near completely healed, extension tear        Impression and Recommendations:     This case required medical decision making of moderate complexity.      Note: This dictation was prepared with Dragon dictation along with smaller phrase technology. Any transcriptional errors that result from this process are unintentional.

## 2016-01-21 NOTE — Patient Instructions (Signed)
It looks beautiful! Ice still after a lot of activity  Try to keep the exercises going 2 times a week  Can take another month to fully resolved Continue the vitamins See me if you need me but you should be good by memorial day !

## 2016-01-21 NOTE — Assessment & Plan Note (Signed)
Overall I do believe the patient will be completely resolved the pain in the next 4 weeks. Encourage her to continue home exercises, icing protocol, which activities to do in which ones to avoid. Patient will continue to do this and will follow-up with basis.

## 2016-05-12 ENCOUNTER — Ambulatory Visit: Payer: BLUE CROSS/BLUE SHIELD | Admitting: Internal Medicine

## 2016-05-12 ENCOUNTER — Telehealth: Payer: Self-pay | Admitting: Internal Medicine

## 2016-05-12 NOTE — Telephone Encounter (Signed)
Patient no showed for 6 month fu on 8/14.  Please advise.

## 2016-05-12 NOTE — Telephone Encounter (Signed)
Please call for reschedule.

## 2016-05-12 NOTE — Telephone Encounter (Signed)
Left message for patient to call back  

## 2016-09-09 ENCOUNTER — Encounter: Payer: Self-pay | Admitting: Internal Medicine

## 2016-09-09 ENCOUNTER — Ambulatory Visit (INDEPENDENT_AMBULATORY_CARE_PROVIDER_SITE_OTHER): Payer: BLUE CROSS/BLUE SHIELD | Admitting: Internal Medicine

## 2016-09-09 DIAGNOSIS — M7062 Trochanteric bursitis, left hip: Secondary | ICD-10-CM | POA: Diagnosis not present

## 2016-09-09 DIAGNOSIS — M25512 Pain in left shoulder: Secondary | ICD-10-CM

## 2016-09-09 HISTORY — DX: Pain in left shoulder: M25.512

## 2016-09-09 HISTORY — DX: Trochanteric bursitis, left hip: M70.62

## 2016-09-09 MED ORDER — TRAMADOL HCL 50 MG PO TABS: 50.0000 mg | ORAL_TABLET | Freq: Three times a day (TID) | ORAL | 0 refills | Status: DC | PRN

## 2016-09-09 MED ORDER — PREDNISONE 20 MG PO TABS
40.0000 mg | ORAL_TABLET | Freq: Every day | ORAL | 0 refills | Status: DC
Start: 2016-09-09 — End: 2017-01-27

## 2016-09-09 MED ORDER — DICLOFENAC SODIUM 75 MG PO TBEC: 75.0000 mg | DELAYED_RELEASE_TABLET | Freq: Two times a day (BID) | ORAL | 0 refills | Status: DC

## 2016-09-09 NOTE — Assessment & Plan Note (Signed)
No known injury, suspect arthritis or remote injury (played basketball in youth). Asked her to return to sports medicine for evaluation and possible injection. Rx for voltaren for pain.

## 2016-09-09 NOTE — Assessment & Plan Note (Signed)
Rx for prednisone burst and voltaren and tramadol. She is given stretching and exercises to use to help with pain.

## 2016-09-09 NOTE — Patient Instructions (Signed)
We have sent in the prednisone for the inflammation. Take 2 pills daily for 5 days.   We have also sent in voltaren which is stronger than aleve which you can take twice a day instead of aleve.   We have also given you a prescription for tramadol in case you have extra pain.   I would recommend to schedule with Dr. Tamala Julian for the shoulder to see if it needs an injection.    Trochanteric Bursitis Rehab Ask your health care provider which exercises are safe for you. Do exercises exactly as told by your health care provider and adjust them as directed. It is normal to feel mild stretching, pulling, tightness, or discomfort as you do these exercises, but you should stop right away if you feel sudden pain or your pain gets worse.Do not begin these exercises until told by your health care provider. Stretching exercises These exercises warm up your muscles and joints and improve the movement and flexibility of your hip. These exercises also help to relieve pain and stiffness. Exercise A: Iliotibial band stretch 1. Lie on your side with your left / right leg in the top position. 2. Bend your left / right knee and grab your ankle. 3. Slowly bring your knee back so your thigh is behind your body. 4. Slowly lower your knee toward the floor until you feel a gentle stretch on the outside of your left / right thigh. If you do not feel a stretch and your knee will not fall farther, place the heel of your other foot on top of your outer knee and pull your thigh down farther. 5. Hold this position for __________ seconds. 6. Slowly return to the starting position. Repeat __________ times. Complete this exercise __________ times a day. Strengthening exercises These exercises build strength and endurance in your hip and pelvis. Endurance is the ability to use your muscles for a long time, even after they get tired. Exercise B: Bridge (hip extensors) 1. Lie on your back on a firm surface with your knees bent and  your feet flat on the floor. 2. Tighten your buttocks muscles and lift your buttocks off the floor until your trunk is level with your thighs. You should feel the muscles working in your buttocks and the back of your thighs. If this exercise is too easy, try doing it with your arms crossed over your chest. 3. Hold this position for __________ seconds. 4. Slowly return to the starting position. 5. Let your muscles relax completely between repetitions. Repeat __________ times. Complete this exercise __________ times a day. Exercise C: Squats (knee extensors and  quadriceps) 1. Stand in front of a table, with your feet and knees pointing straight ahead. You may rest your hands on the table for balance but not for support. 2. Slowly bend your knees and lower your hips like you are going to sit in a chair.  Keep your weight over your heels, not over your toes.  Keep your lower legs upright so they are parallel with the table legs.  Do not let your hips go lower than your knees.  Do not bend lower than told by your health care provider.  If your hip pain increases, do not bend as low. 3. Hold this position for __________ seconds. 4. Slowly push with your legs to return to standing. Do not use your hands to pull yourself to standing. Repeat __________ times. Complete this exercise __________ times a day. Exercise D: Hip hike 1. Stand sideways  on a bottom step. Stand on your left / right leg with your other foot unsupported next to the step. You can hold onto the railing or wall if needed for balance. 2. Keeping your knees straight and your torso square, lift your left / right hip up toward the ceiling. 3. Hold this position for __________ seconds. 4. Slowly let your left / right hip lower toward the floor, past the starting position. Your foot should get closer to the floor. Do not lean or bend your knees. Repeat __________ times. Complete this exercise __________ times a day. Exercise E: Single  leg stand 1. Stand near a counter or door frame that you can hold onto for balance as needed. It is helpful to stand in front of a mirror for this exercise so you can watch your hip. 2. Squeeze your left / right buttock muscles then lift up your other foot. Do not let your left / right hip push out to the side. 3. Hold this position for __________ seconds. Repeat __________ times. Complete this exercise __________ times a day. This information is not intended to replace advice given to you by your health care provider. Make sure you discuss any questions you have with your health care provider. Document Released: 10/23/2004 Document Revised: 05/22/2016 Document Reviewed: 08/31/2015 Elsevier Interactive Patient Education  2017 [employer].

## 2016-09-09 NOTE — Progress Notes (Signed)
   Subjective:    Patient ID: Jill Shah, female    DOB: November 08, 1960, 55 y.o.   MRN: [id number]  HPI The patient is a 55 YO female coming in for left shoulder and left hip pain. Both began about 3 weeks or so ago. No injury or overuse that she knows of. Tried aleve at home with mild temporary relief. No rash on the joint or swelling of the joint. Still walking well. No weakness in her hand or dropping anything.   Review of Systems  Constitutional: Negative for activity change, appetite change, fatigue, fever and unexpected weight change.  Respiratory: Negative.   Cardiovascular: Negative.   Gastrointestinal: Negative.   Musculoskeletal: Positive for arthralgias, back pain and myalgias. Negative for gait problem, joint swelling, neck pain and neck stiffness.  Skin: Negative.       Objective:   Physical Exam  Constitutional: She is oriented to person, place, and time. She appears well-developed and well-nourished.  HENT:  Head: Normocephalic and atraumatic.  Eyes: EOM are normal.  Neck: Normal range of motion.  Cardiovascular: Normal rate.   Pulmonary/Chest: Effort normal.  Abdominal: Soft.  Musculoskeletal: She exhibits tenderness.  Pain in the left shoulder and left trochanteric bursa, no groin pain, no pain in the left elbow, biceps muscle, neck, scapula.   Neurological: She is alert and oriented to person, place, and time. Coordination normal.  Skin: Skin is warm and dry.   Vitals:   09/09/16 1101 09/09/16 1120  BP: (!) 162/100 (!) 144/100  Pulse: 75   Resp: 18   Temp: 98.9 F (37.2 C)   TempSrc: Oral   SpO2: 98%   Weight: 201 lb (91.2 kg)   Height: 5' (1.524 m)       Assessment & Plan:

## 2016-09-09 NOTE — Progress Notes (Signed)
Pre visit review using our clinic review tool, if applicable. No additional management support is needed unless otherwise documented below in the visit note. 

## 2016-09-24 NOTE — Progress Notes (Addendum)
[facility] [place], [place] [address] Phone: [phone] Subjective:    I'm seeing this patient by the request  of:  Hoyt Koch, MD   CC: Left shoulder pain Left hip pain,  [id number]  Jill Shah is a 55 y.o. female coming in with complaint of left shoulder pain. Patient is had this pain for quite some time but significant worsening for the last 3 weeks. Patient does not remember any true injury. Has been taking over-the-counter anti-inflammatories with mild improvement. Denies any radiation of pain. Denies any weakness in the hand or any numbness. Patient states 7 out of 10. Starting wake her up at night.  Patient is also complaining of left hip pain. Was seen by primary provider and diagnosed with more of a greater trochanteric bursitis. Given an injection but has not been able to do it. Continues to give her trouble such as when she wakes up in the morning takes her about 20-30 minutes get out of bed. States that when she does activity seems to get better. Denies any fevers chills or any abnormal weight loss.   History of back surgery. States that her back seems to be worsening as well. Affecting daily activities. Make it difficult to do activities on a regular basis. Patient's denies any significant radiation down the legs. Feels like she is compensating for her because how she is walking.     Past Medical History:  Diagnosis Date  . Hypercholesterolemia   . Hypertension    Past Surgical History:  Procedure Laterality Date  . CESAREAN SECTION     x2  . fibroid ablation    . TUBAL LIGATION     Social History   Social History  . Marital status: Married    Spouse name: N/A  . Number of children: N/A  . Years of education: N/A   Social History Main Topics  . Smoking status: Never Smoker  . Smokeless tobacco: Never Used  . Alcohol use No  . Drug use: No  . Sexual activity: Not Asked   Other Topics  Concern  . None   Social History Narrative  . None   Allergies  Allergen Reactions  . Hydrocodone    Family History  Problem Relation Age of Onset  . Colon cancer Neg Hx   . Colon polyps Neg Hx   . Diabetes Mother   . Lung cancer Mother   . Pancreatic cancer Sister   . Other Brother     aids    Past medical history, social, surgical and family history all reviewed in electronic medical record.  No pertanent information unless stated regarding to the chief complaint.   Review of Systems:Review of systems updated and as accurate as of 09/25/16  No headache, visual changes, nausea, vomiting, diarrhea, constipation, dizziness, abdominal pain, skin rash, fevers, chills, night sweats, weight loss, swollen lymph nodes, chest pain, shortness of breath, mood changes.   Objective  Blood pressure (!) 144/88, pulse 91, height 5' (1.524 m), weight 202 lb (91.6 kg), SpO2 97 %. Systems examined below as of 09/25/16   General: No apparent distress alert and oriented x3 mood and affect normal, dressed appropriately.  HEENT: Pupils equal, extraocular movements intact  Respiratory: Patient's speak in full sentences and does not appear short of breath  Cardiovascular: No lower extremity edema, non tender, no erythema  Skin: Warm dry intact with no signs of infection or rash on extremities or on axial skeleton.  Abdomen: Soft nontender  Neuro: Cranial nerves II through XII are intact, neurovascularly intact in all extremities with 2+ DTRs and 2+ pulses.  Lymph: No lymphadenopathy of posterior or anterior cervical chain or axillae bilaterally.  Gait normal with good balance and coordination.  MSK:  Non tender with full range of motion and good stability and symmetric strength and tone of elbows, wrist, knee and ankles bilaterally.  Shoulder: left Inspection reveals no abnormalities, atrophy or asymmetry. Palpation is normal with no tenderness over AC joint or bicipital groove. ROM is full in all  planes passively. Rotator cuff strength normal throughout. signs of impingement with positive Neer and Hawkin's tests, but negative empty can sign. Speeds and Yergason's tests normal. No labral pathology noted with negative Obrien's, negative clunk and good stability. Normal scapular function observed. No painful arc and no drop arm sign. No apprehension sign  MSK US performed of: left This study was ordered, performed, and interpreted by Charlann Boxer D.O.  Shoulder:   Supraspinatus:  Appears normal on long and transverse views, Bursal bulge seen with shoulder abduction on impingement view. Infraspinatus:  Appears normal on long and transverse views. Significant increase in Doppler flow Subscapularis:  Appears normal on long and transverse views. Positive bursa Teres Minor:  Appears normal on long and transverse views. AC joint:  Capsule undistended, no geyser sign. Glenohumeral Joint:  Appears normal without effusion. Glenoid Labrum:  Intact without visualized tears. Biceps Tendon:  Appears normal on long and transverse views, no fraying of tendon, tendon located in intertubercular groove, no subluxation with shoulder internal or external rotation.  Impression: Subacromial bursitis  Procedure: Real-time Ultrasound Guided Injection of left glenohumeral joint Device: GE Logiq E  Ultrasound guided injection is preferred based studies that show increased duration, increased effect, greater accuracy, decreased procedural pain, increased response rate with ultrasound guided versus blind injection.  Verbal informed consent obtained.  Time-out conducted.  Noted no overlying erythema, induration, or other signs of local infection.  Skin prepped in a sterile fashion.  Local anesthesia: Topical Ethyl chloride.  With sterile technique and under real time ultrasound guidance:  Joint visualized.  23g 1  inch needle inserted posterior approach. Pictures taken for needle placement. Patient did have  injection of 2 cc of 1% lidocaine, 2 cc of 0.5% Marcaine, and 1.0 cc of Kenalog 40 mg/dL. Completed without difficulty  Pain immediately resolved suggesting accurate placement of the medication.  Advised to call if fevers/chills, erythema, induration, drainage, or persistent bleeding.  Images permanently stored and available for review in the ultrasound unit.  Impression: Technically successful ultrasound guided injection.  MSK US performed of: left  This study was ordered, performed, and interpreted by Charlann Boxer D.O.  Hip: Trochanteric bursa with significant hypoechoic changes and swelling Acetabular labrum visualized and without tears, displacement, or effusion in joint. Femoral neck appears unremarkable without increased power doppler signal along Cortex.  IMPRESSION:  Greater trochanter bursitis   Procedure: Real-time Ultrasound Guided Injection of left greater trochanteric bursitis secondary to patient's body habitus Device: GE Logiq E  Ultrasound guided injection is preferred based studies that show increased duration, increased effect, greater accuracy, decreased procedural pain, increased response rate, and decreased cost with ultrasound guided versus blind injection.  Verbal informed consent obtained.  Time-out conducted.  Noted no overlying erythema, induration, or other signs of local infection.  Skin prepped in a sterile fashion.  Local anesthesia: Topical Ethyl chloride.  With sterile technique and under real time ultrasound guidance:  Greater trochanteric  area was visualized and patient's bursa was noted. A 22-gauge 3 inch needle was inserted and 4 cc of 0.5% Marcaine and 1 cc of Kenalog 40 mg/dL was injected. Pictures taken Completed without difficulty  Pain immediately resolved suggesting accurate placement of the medication.  Advised to call if fevers/chills, erythema, induration, drainage, or persistent bleeding.  Images permanently stored and available for review in  the ultrasound unit.  Impression: Technically successful ultrasound guided injection.      Impression and Recommendations:     This case required medical decision making of moderate complexity.      Note: This dictation was prepared with Dragon dictation along with smaller phrase technology. Any transcriptional errors that result from this process are unintentional.

## 2016-09-25 ENCOUNTER — Ambulatory Visit (INDEPENDENT_AMBULATORY_CARE_PROVIDER_SITE_OTHER): Payer: BLUE CROSS/BLUE SHIELD | Admitting: Family Medicine

## 2016-09-25 ENCOUNTER — Encounter: Payer: Self-pay | Admitting: Family Medicine

## 2016-09-25 ENCOUNTER — Ambulatory Visit: Payer: Self-pay

## 2016-09-25 VITALS — BP 144/88 | HR 91 | Ht 60.0 in | Wt 202.0 lb

## 2016-09-25 DIAGNOSIS — M7502 Adhesive capsulitis of left shoulder: Secondary | ICD-10-CM | POA: Diagnosis not present

## 2016-09-25 DIAGNOSIS — M7062 Trochanteric bursitis, left hip: Secondary | ICD-10-CM | POA: Diagnosis not present

## 2016-09-25 DIAGNOSIS — M25512 Pain in left shoulder: Secondary | ICD-10-CM

## 2016-09-25 DIAGNOSIS — M7500 Adhesive capsulitis of unspecified shoulder: Secondary | ICD-10-CM | POA: Insufficient documentation

## 2016-09-25 HISTORY — DX: Adhesive capsulitis of unspecified shoulder: M75.00

## 2016-09-25 MED ORDER — VITAMIN D (ERGOCALCIFEROL) 1.25 MG (50000 UNIT) PO CAPS: 50000.0000 [IU] | ORAL_CAPSULE | ORAL | 0 refills | Status: DC

## 2016-09-25 NOTE — Patient Instructions (Addendum)
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 Ice 20 minutes 2 times daily. Usually after activity and before bed. Try to keep hands within peripheral visoin. Once weekly vitamin D for next 12 weeks.   See me againin 4-6 weeks Happy New Year!

## 2016-09-25 NOTE — Assessment & Plan Note (Signed)
Patient given injection today. Tolerated the procedure well. We discussed icing regimen and home exercises. Discussed which activities doing which ones to avoid. Decline formal physical therapy. Follow-up again in 4 weeks

## 2016-10-30 ENCOUNTER — Ambulatory Visit: Payer: BLUE CROSS/BLUE SHIELD | Admitting: Family Medicine

## 2017-01-05 ENCOUNTER — Telehealth: Payer: Self-pay | Admitting: Internal Medicine

## 2017-01-05 NOTE — Telephone Encounter (Signed)
Pt does not have insurance anymore, would like to know how much a  Cortisone shot would be without insurance.

## 2017-01-13 NOTE — Telephone Encounter (Signed)
Estimated pricing using [id number], [id number], [id number] = $483.00 before 55% uninsured discount. Pt has appt 5/1, noted.

## 2017-01-27 ENCOUNTER — Encounter: Payer: Self-pay | Admitting: Family Medicine

## 2017-01-27 ENCOUNTER — Ambulatory Visit (INDEPENDENT_AMBULATORY_CARE_PROVIDER_SITE_OTHER): Payer: Self-pay | Admitting: Family Medicine

## 2017-01-27 VITALS — BP 154/92 | HR 82 | Resp 16 | Wt 198.8 lb

## 2017-01-27 DIAGNOSIS — M25559 Pain in unspecified hip: Secondary | ICD-10-CM

## 2017-01-27 DIAGNOSIS — M5416 Radiculopathy, lumbar region: Secondary | ICD-10-CM

## 2017-01-27 HISTORY — DX: Radiculopathy, lumbar region: M54.16

## 2017-01-27 MED ORDER — METHYLPREDNISOLONE ACETATE 80 MG/ML IJ SUSP
80.0000 mg | Freq: Once | INTRAMUSCULAR | Status: AC
  Administered 2017-01-27: 80 mg via INTRAMUSCULAR

## 2017-01-27 MED ORDER — PREDNISONE 50 MG PO TABS: 50.0000 mg | ORAL_TABLET | Freq: Every day | ORAL | 0 refills | Status: DC

## 2017-01-27 MED ORDER — KETOROLAC TROMETHAMINE 60 MG/2ML IM SOLN
60.0000 mg | Freq: Once | INTRAMUSCULAR | Status: AC
  Administered 2017-01-27: 60 mg via INTRAMUSCULAR

## 2017-01-27 MED ORDER — GABAPENTIN 100 MG PO CAPS: 200.0000 mg | ORAL_CAPSULE | Freq: Every day | ORAL | 3 refills | Status: DC

## 2017-01-27 NOTE — Patient Instructions (Addendum)
Good to see you  xrays downstairs today  We need to do injections today  Gabapentin 200mg  at night Prednisone daily for 5 days starting tomorrow.  Ice 20 minutes 2 times daily. Usually after activity and before bed. See me again in 1-2 weeks (Ok to double book)

## 2017-01-27 NOTE — Progress Notes (Signed)
[facility] [name], [place] [address] Phone: [phone] Subjective:    I'm seeing this patient by the request  of:    CC: Left hip pain follow-up  [id number]  Jill Shah is a 56 y.o. female coming in with complaint of left hip and left shoulder pain. Patient was seen previously 5 months ago. Responded well to injection in the greater trochanteric area. Patient states was doing well but now is having increasing pain again. Maybe a little bit different this time with some associated back pain. Seems to be radiating down the posterior aspect the leg a lot more than previously as well. She denies any weakness but states that the pain is severe enough that has stopped her from some activity and sometimes can keep her up at night. Not responding well to over-the-counter medications at this time.       Past Medical History:  Diagnosis Date  . Hypercholesterolemia   . Hypertension    Past Surgical History:  Procedure Laterality Date  . CESAREAN SECTION     x2  . fibroid ablation    . TUBAL LIGATION     Social History   Social History  . Marital status: Married    Spouse name: Jill Shah  . Number of children: Jill Shah  . Years of education: Jill Shah   Social History Main Topics  . Smoking status: Never Smoker  . Smokeless tobacco: Never Used  . Alcohol use No  . Drug use: No  . Sexual activity: Not on file   Other Topics Concern  . Not on file   Social History Narrative  . No narrative on file   Allergies  Allergen Reactions  . Hydrocodone    Family History  Problem Relation Age of Onset  . Colon cancer Neg Hx   . Colon polyps Neg Hx   . Diabetes Mother   . Lung cancer Mother   . Pancreatic cancer Sister   . Other Brother     aids    Past medical history, social, surgical and family history all reviewed in electronic medical record.  No pertanent information unless stated regarding to the chief complaint.   Review  of Systems: No headache, visual changes, nausea, vomiting, diarrhea, constipation, dizziness, abdominal pain, skin rash, fevers, chills, night sweats, weight loss, swollen lymph nodes, body aches, joint swelling, muscle aches, chest pain, shortness of breath, mood changes.  Positive muscle aches   Objective  There were no vitals taken for this visit.   Systems examined below as of 01/27/17 General: NAD A&O x3 mood, affect normal  HEENT: Pupils equal, extraocular movements intact no nystagmus Respiratory: not short of breath at rest or with speaking Cardiovascular: No lower extremity edema, non tender Skin: Warm dry intact with no signs of infection or rash on extremities or on axial skeleton. Abdomen: Soft nontender, no masses Neuro: Cranial nerves  intact, neurovascularly intact in all extremities with 2+ DTRs and 2+ pulses. Lymph: No lymphadenopathy appreciated today  Gait mild antalgic gait MSK: Non tender with full range of motion and good stability and symmetric strength and tone of shoulders, elbows, wrist,  knee hips and ankles bilaterally.   Back Exam:  Inspection: Unremarkable  Motion: Flexion 35 deg, Extension 25 deg, Side Bending to 25 deg bilaterally,  Rotation to 25 deg bilaterally  SLR laying: Positive straight leg test on the left XSLR laying: Negative  Palpable tenderness: Severe tenderness to palpation in the paraspinal musculature  over L5-S1 on the left side. FABER: Tightness left. Sensory change: Gross sensation intact to all lumbar and sacral dermatomes.  Reflexes: 2+ at both patellar tendons, 2+ at achilles tendons, Babinski's downgoing.  Strength at foot  Plantar-flexion: 5/5 Dorsi-flexion: 5/5 Eversion: 5/5 Inversion: 5/5  Leg strength  Quad: 5/5 Hamstring: 5/5 Hip flexor: 5/5 Hip abductors: 5/5     Impression and Recommendations:     This case required medical decision making of moderate complexity.      Note: This dictation was prepared with Dragon  dictation along with smaller phrase technology. Any transcriptional errors that result from this process are unintentional.

## 2017-01-27 NOTE — Progress Notes (Signed)
Pre-visit discussion using our clinic review tool. No additional management support is needed unless otherwise documented below in the visit note.  

## 2017-01-27 NOTE — Assessment & Plan Note (Signed)
Patient is having more of a lumbar radiculopathy. Seems to be having significant discomfort overall. Patient is having the radicular symptoms. Started on gabapentin, prednisone, given injections today. We discussed icing regimen. Follow-up in 1-2 weeks and if any weakness she needs to seek medical attention immediately. No improvement we may need to consider advanced imaging.

## 2017-02-11 ENCOUNTER — Ambulatory Visit: Payer: Self-pay | Admitting: Family Medicine

## 2017-07-20 ENCOUNTER — Encounter (HOSPITAL_COMMUNITY): Payer: Self-pay

## 2017-07-20 DIAGNOSIS — I10 Essential (primary) hypertension: Secondary | ICD-10-CM | POA: Insufficient documentation

## 2017-07-20 DIAGNOSIS — R110 Nausea: Secondary | ICD-10-CM | POA: Insufficient documentation

## 2017-07-20 DIAGNOSIS — M25551 Pain in right hip: Secondary | ICD-10-CM | POA: Insufficient documentation

## 2017-07-20 DIAGNOSIS — Z79899 Other long term (current) drug therapy: Secondary | ICD-10-CM | POA: Insufficient documentation

## 2017-07-20 DIAGNOSIS — R1031 Right lower quadrant pain: Secondary | ICD-10-CM | POA: Insufficient documentation

## 2017-07-20 LAB — COMPREHENSIVE METABOLIC PANEL
ALK PHOS: 67 U/L (ref 38–126)
ALT: 19 U/L (ref 14–54)
ANION GAP: 8 (ref 5–15)
AST: 21 U/L (ref 15–41)
Albumin: 4.2 g/dL (ref 3.5–5.0)
BILIRUBIN TOTAL: 0.7 mg/dL (ref 0.3–1.2)
BUN: 8 mg/dL (ref 6–20)
CO2: 29 mmol/L (ref 22–32)
CREATININE: 0.85 mg/dL (ref 0.44–1.00)
Calcium: 9.3 mg/dL (ref 8.9–10.3)
Chloride: 101 mmol/L (ref 101–111)
GFR calc non Af Amer: >60 mL/min (ref >60)
Glucose, Bld: 95 mg/dL (ref 65–99)
Potassium: 3 mmol/L — ABNORMAL LOW (ref 3.5–5.1)
Sodium: 138 mmol/L (ref 135–145)
TOTAL PROTEIN: 7.2 g/dL (ref 6.5–8.1)

## 2017-07-20 LAB — LIPASE, BLOOD: Lipase: 17 U/L (ref 11–51)

## 2017-07-20 LAB — CBC
HCT: 37.4 % (ref 36.0–46.0)
HEMOGLOBIN: 12.3 g/dL (ref 12.0–15.0)
MCH: 27.6 pg (ref 26.0–34.0)
MCHC: 32.9 g/dL (ref 30.0–36.0)
MCV: 83.9 fL (ref 78.0–100.0)
PLATELETS: 248 10*3/uL (ref 150–400)
RBC: 4.46 MIL/uL (ref 3.87–5.11)
RDW: 15.5 % (ref 11.5–15.5)
WBC: 6.4 10*3/uL (ref 4.0–10.5)

## 2017-07-20 LAB — URINALYSIS, ROUTINE W REFLEX MICROSCOPIC
BACTERIA UA: NONE SEEN
Bilirubin Urine: NEGATIVE
Glucose, UA: NEGATIVE mg/dL
Hgb urine dipstick: NEGATIVE
Ketones, ur: NEGATIVE mg/dL
Leukocytes, UA: NEGATIVE
Nitrite: NEGATIVE
PROTEIN: 30 mg/dL — AB
SPECIFIC GRAVITY, URINE: 1.023 (ref 1.005–1.030)
pH: 6 (ref 5.0–8.0)

## 2017-07-20 MED ORDER — ONDANSETRON 4 MG PO TBDP
ORAL_TABLET | ORAL | Status: AC
Start: 2017-07-20 — End: 2017-07-20
  Administered 2017-07-20: 4 mg
  Filled 2017-07-20: qty 1

## 2017-07-20 MED ORDER — ONDANSETRON 4 MG PO TBDP: 4.0000 mg | ORAL_TABLET | Freq: Once | ORAL | Status: DC | PRN

## 2017-07-20 MED ORDER — OXYCODONE-ACETAMINOPHEN 5-325 MG PO TABS
ORAL_TABLET | ORAL | Status: AC
  Administered 2017-07-20: 1
  Filled 2017-07-20: qty 1

## 2017-07-20 MED ORDER — OXYCODONE-ACETAMINOPHEN 5-325 MG PO TABS: 1.0000 | ORAL_TABLET | ORAL | Status: DC | PRN

## 2017-07-20 NOTE — ED Triage Notes (Signed)
Pt state she started having severe abdominal pain saturday that has increased; pt states it started to affect her walking; Pt starts pain is sharp 10/10; pt states she still has appendix; pt states she is nausea; pt a&ox 4 on arrival; Pt is hypertensive at triage but states hx with no meds for 2 years;-Monique,RN

## 2017-07-21 ENCOUNTER — Emergency Department (HOSPITAL_COMMUNITY)
Admission: EM | Admit: 2017-07-21 | Discharge: 2017-07-21 | Disposition: A | Discharge status: Home / Self Care | Payer: Self-pay | Attending: Emergency Medicine | Admitting: Emergency Medicine

## 2017-07-21 ENCOUNTER — Emergency Department (HOSPITAL_COMMUNITY): Payer: Self-pay

## 2017-07-21 DIAGNOSIS — M25551 Pain in right hip: Secondary | ICD-10-CM

## 2017-07-21 DIAGNOSIS — R1031 Right lower quadrant pain: Secondary | ICD-10-CM

## 2017-07-21 MED ORDER — CYCLOBENZAPRINE HCL 10 MG PO TABS: 10.0000 mg | ORAL_TABLET | Freq: Two times a day (BID) | ORAL | 0 refills | Status: DC | PRN

## 2017-07-21 MED ORDER — DICLOFENAC SODIUM 1 % TD GEL: 2.0000 g | Freq: Four times a day (QID) | TRANSDERMAL | 0 refills | Status: DC

## 2017-07-21 MED ORDER — IOPAMIDOL (ISOVUE-300) INJECTION 61%
INTRAVENOUS | Status: AC
  Administered 2017-07-21: 100 mL
  Filled 2017-07-21: qty 100

## 2017-07-21 NOTE — ED Notes (Signed)
ED Provider at bedside. 

## 2017-07-21 NOTE — Discharge Instructions (Signed)
Take 600 mg of ibuprofen with or 650 mg of Tylenol every 6 hours as needed. Apply ice or heat up to 3-4 times per day for 15-20 minutes for pain relief. You can apply diclofenac gel to any areas that are sore up to 4 times per day. Flexeril can be taken up to 2 times per day. Please do not take this medication after work or drive because it can make you sleepy.  Please follow up with Dr. Tamala Julian because you may need another injection in your right hip.   If you develop lime green or bloody vomiting, severe diarrhea, fever, chills, or other new or concerning symptoms, please return to the emergency department for re-evaluation.

## 2017-07-21 NOTE — ED Notes (Signed)
Transported to CT 

## 2017-07-21 NOTE — ED Provider Notes (Signed)
[facility] EMERGENCY DEPARTMENT Provider Note   CSN: [id number] Arrival date & time: 07/20/17  1901     History   Chief Complaint Chief Complaint  Patient presents with  . Abdominal Pain    HPI Jill Shah is a 56 y.o. female who presents to the emergency department with a chief complaint of sharp, constant right lower quadrant pain that radiates to her right low back that has significantly worsened since onset 2 days ago. She reports the pain is worse with ambulation. She has noted mild improvement with Aleve. She reports associated nausea. No emesis, diarrhea, fever, chills, rash, dysuria, frequency, or vaginal pain, itching, bleeding, or discharge. No numbness or weakness.   PMH includes trochanteric bursitis.   The history is provided by the patient. No language interpreter was used.    Past Medical History:  Diagnosis Date  . Hypercholesterolemia   . Hypertension     Patient Active Problem List   Diagnosis Date Noted  . Lumbar radiculopathy 01/27/2017  . 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 Left shoulder pain 09/09/2016  . Trochanteric bursitis of left hip 09/09/2016  . Lateral epicondylitis of right elbow 11/19/2015  . 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 Right elbow pain 11/08/2015  . HYPERCHOLESTEROLEMIA 07/11/2010  . OBESITY 07/11/2010  . HYPERTENSION, BENIGN ESSENTIAL 07/11/2010  . 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 CESAREAN SECTION     x2  . fibroid ablation    . TUBAL LIGATION      OB History    No data available       Home Medications    Prior to Admission medications   Medication Sig Start Date End Date Taking? Authorizing Provider  Ferrous Sulfate (IRON) 325 (65 FE) MG TABS Take 325 mg by mouth 2 (two) times daily.    Yes [provider]  naproxen sodium (ANAPROX) 220 MG tablet Take 220 mg by mouth 2 (two) times  daily with a meal.   Yes [provider]  cyclobenzaprine (FLEXERIL) 10 MG tablet Take 1 tablet (10 mg total) by mouth 2 (two) times daily as needed for muscle spasms. 07/21/17   Nyomie Ehrlich A, PA-C  diclofenac sodium (VOLTAREN) 1 % GEL Apply 2 g topically 4 (four) times daily. 07/21/17   Stephaniemarie Stoffel A, PA-C    Family History Family History  Problem Relation Age of Onset  . Diabetes Mother   . Lung cancer Mother   . Pancreatic cancer Sister   . Other Brother        aids  . Colon cancer Neg Hx   . Colon polyps Neg Hx     Social History Social History  Substance Use Topics  . Smoking status: Never Smoker  . Smokeless tobacco: Never Used  . Alcohol use No     Allergies   Patient has no known allergies.   Review of Systems Review of Systems  Constitutional: Negative for activity change, chills and fever.  Respiratory: Negative for shortness of breath.   Cardiovascular: Negative for chest pain.  Gastrointestinal: Positive for abdominal pain and nausea. Negative for abdominal distention, constipation, diarrhea and vomiting.  Genitourinary: Negative for dysuria, urgency, vaginal discharge and vaginal pain.  Musculoskeletal: Positive for back pain and gait problem.  Skin: Negative for rash.  Neurological: Negative for weakness and numbness.   Physical Exam Updated Vital Signs BP 126/74   Pulse 62   Temp  98.3 F (36.8 C) (Oral)   Resp 17   SpO2 100%   Physical Exam  Constitutional: No distress.  HENT:  Head: Normocephalic.  Eyes: Conjunctivae are normal.  Neck: Neck supple.  Cardiovascular: Normal rate, regular rhythm and normal heart sounds.  Exam reveals no gallop and no friction rub.   No murmur heard. Pulmonary/Chest: Effort normal and breath sounds normal. No respiratory distress. She has no wheezes. She has no rales.  Abdominal: Soft. Bowel sounds are normal. She exhibits no distension and no mass. There is tenderness. There is guarding. There is  no rebound.  TTP over the RLQ and RUQ with moderate guarding. No CVA tenderness bilaterally. Negative Murphy's sign. No peritoneal signs. Negative psoas sign.   Musculoskeletal: She exhibits tenderness.  Full ROM of the bilateral hips. Antalgic gait.  Normal bilateral knee and ankle exam. DP and PT pulses are 2+ and symmetric.   TTP over the right flank and right lumbar spine. No tenderness palpation of the spinous processes of the cervical, thoracic, or lumbar spine.  Neurological: She is alert.  Skin: Skin is warm. No rash noted. She is not diaphoretic.  Psychiatric: Her behavior is normal.  Nursing note and vitals reviewed.    ED Treatments / Results  Labs (all labs ordered are listed, but only abnormal results are displayed) Labs Reviewed  COMPREHENSIVE METABOLIC PANEL - Abnormal; Notable for the following:       Result Value   Potassium 3.0 (*)    All other components within normal limits  URINALYSIS, ROUTINE W REFLEX MICROSCOPIC - Abnormal; Notable for the following:    Protein, ur 30 (*)    Squamous Epithelial / LPF 0-5 (*)    All other components within normal limits  LIPASE, BLOOD  CBC    EKG  EKG Interpretation None       Radiology Ct Abdomen Pelvis W Contrast  Result Date: 07/21/2017 CLINICAL DATA:  Acute onset of worsening generalized abdominal pain. Initial encounter. EXAM: CT ABDOMEN AND PELVIS WITH CONTRAST TECHNIQUE: Multidetector CT imaging of the abdomen and pelvis was performed using the standard protocol following bolus administration of intravenous contrast. CONTRAST:  145mL ISOVUE-300 IOPAMIDOL (ISOVUE-300) INJECTION 61% COMPARISON:  Pelvic ultrasound performed 08/20/2012 FINDINGS: Lower chest: The visualized lung bases are grossly clear. The visualized portions of the mediastinum are unremarkable. Hepatobiliary: The liver is unremarkable in appearance. The gallbladder is unremarkable in appearance. The common bile duct remains normal in caliber.  Pancreas: The pancreas is within normal limits. Spleen: The spleen is unremarkable in appearance. Adrenals/Urinary Tract: The adrenal glands are unremarkable in appearance. The kidneys are within normal limits. There is no evidence of hydronephrosis. No renal or ureteral stones are identified. No perinephric stranding is seen. Stomach/Bowel: The stomach is unremarkable in appearance. The small bowel is within normal limits. The appendix is normal in caliber, without evidence of appendicitis. The colon is unremarkable in appearance. Vascular/Lymphatic: The abdominal aorta is unremarkable in appearance. The inferior vena cava is grossly unremarkable. No retroperitoneal lymphadenopathy is seen. No pelvic sidewall lymphadenopathy is identified. Reproductive: The bladder is mildly distended and grossly unremarkable. Small uterine fibroids are noted. The ovaries are relatively symmetric. No suspicious adnexal masses are seen. Other: No additional soft tissue abnormalities are seen. Musculoskeletal: No acute osseous abnormalities are identified. The patient is status post lumbar spinal fusion at L4-L5. The visualized musculature is unremarkable in appearance. IMPRESSION: 1. No acute abnormality seen within the abdomen or pelvis. 2. Small uterine fibroids noted. Electronically  Signed   By: Garald Balding M.D.   On: 07/21/2017 03:42    Procedures Procedures (including critical care time)  Medications Ordered in ED Medications  ondansetron (ZOFRAN-ODT) 4 MG disintegrating tablet (4 mg  Given 07/20/17 2040)  oxyCODONE-acetaminophen (PERCOCET/ROXICET) 5-325 MG per tablet (1 tablet  Given 07/20/17 2040)  iopamidol (ISOVUE-300) 61 % injection (100 mLs  Contrast Given 07/21/17 0323)     Initial Impression / Assessment and Plan / ED Course  I have reviewed the triage vital signs and the nursing notes.  Pertinent labs & imaging results that were available during my care of the patient were reviewed by me and  considered in my medical decision making (see chart for details).     56 year old female presenting with RLQ abdominal pain radiating to the right pain x2 days with nausea. Aggravating factors include walking. No recent trauma or injury. Questionable +psoas sign on physical eam. Afebrile. K 3.0, but labs are otherwise unremarkable. UA with minimal proteinuria, but otherwise unremarkable. CT A/P with small uterine fibroids noted, but not concerning for appendicitis, TOA, nephrolithiasis, or peritonitis. No bony lesions. She has a h/o of trochanteric bursitis in the left hip. I am uncertain of the etiology of her pain, but I suspect it is muskuloskeletal in nature of the right hip. Encouraged the patient to follow up with her PCP. Conservative management recommended. Will d/c with RX for voltaren gel and Flexeril. NAD. VSS. The patient is safe for d/c at this time.   Final Clinical Impressions(s) / ED Diagnoses   Final diagnoses:  Right hip pain  Right lower quadrant pain    New Prescriptions Discharge Medication List as of 07/21/2017  4:31 AM    START taking these medications   Details  cyclobenzaprine (FLEXERIL) 10 MG tablet Take 1 tablet (10 mg total) by mouth 2 (two) times daily as needed for muscle spasms., Starting Tue 07/21/2017, Print    diclofenac sodium (VOLTAREN) 1 % GEL Apply 2 g topically 4 (four) times daily., Starting Tue 07/21/2017, Print         Kemoni Ortega A, PA-C 07/22/17 1637    Merryl Hacker, MD 07/26/17 [phone]

## 2017-10-26 NOTE — Progress Notes (Signed)
[name] [place], [place] [address] Phone: [phone] Subjective:     CC: Right hip pain  GBT:[id number]  Jill Shah is a 57 y.o. female coming in with complaint of right hip pain that radiates to the left hip and down the right leg. She has had an increase in her pain for the past 2 weeks. She is in sharp, constant pain. She has tried Aleve and topical analgesic for relief. Patient states that she went to the ER due to the pain back in November 2018.  Patient has noted some increasing discomfort of the lower back as well.  Patient states that it can be severe.  Patient denies any numbness at the point but does feel like it radiates to the right hip and then down the leg sometimes.  Sometimes feels like her leg is heavy.     Past Medical History:  Diagnosis Date  . Hypercholesterolemia   . Hypertension    Past Surgical History:  Procedure Laterality Date  . CESAREAN SECTION     x2  . fibroid ablation    . TUBAL LIGATION     Social History   Socioeconomic History  . Marital status: Married    Spouse name: Not on file  . Number of children: Not on file  . Years of education: Not on file  . Highest education level: Not on file  Social Needs  . Financial resource strain: Not on file  . Food insecurity - worry: Not on file  . Food insecurity - inability: Not on file  . Transportation needs - medical: Not on file  . Transportation needs - non-medical: Not on file  Occupational History  . Not on file  Tobacco Use  . Smoking status: Never Smoker  . Smokeless tobacco: Never Used  Substance and Sexual Activity  . Alcohol use: No    Alcohol/week: 0.0 oz  . Drug use: No  . Sexual activity: Not on file  Other Topics Concern  . Not on file  Social History Narrative  . Not on file   No Known Allergies Family History  Problem Relation Age of Onset  . Diabetes Mother   . Lung cancer Mother   . Pancreatic cancer  Sister   . Other Brother        aids  . Colon cancer Neg Hx   . Colon polyps Neg Hx      Past medical history, social, surgical and family history all reviewed in electronic medical record.  No pertanent information unless stated regarding to the chief complaint.   Review of Systems:Review of systems updated and as accurate as of 10/27/17  No headache, visual changes, nausea, vomiting, diarrhea, constipation, dizziness, abdominal pain, skin rash, fevers, chills, night sweats, weight loss, swollen lymph nodes, body aches, joint swelling, , chest pain, shortness of breath, mood changes.  Positive muscle aches  Objective  Blood pressure (!) 150/92, pulse 80, height 5' (1.524 m), weight 202 lb (91.6 kg), SpO2 98 %. Systems examined below as of 10/27/17   General: No apparent distress alert and oriented x3 mood and affect normal, dressed appropriately.  HEENT: Pupils equal, extraocular movements intact  Respiratory: Patient's speak in full sentences and does not appear short of breath  Cardiovascular: No lower extremity edema, non tender, no erythema  Skin: Warm dry intact with no signs of infection or rash on extremities or on axial skeleton.  Abdomen: Soft nontender  Neuro: Cranial nerves  II through XII are intact, neurovascularly intact in all extremities with 2+ DTRs and 2+ pulses.  Lymph: No lymphadenopathy of posterior or anterior cervical chain or axillae bilaterally.  Gait antalgic gait MSK:  Non tender with full range of motion and good stability and symmetric strength and tone of shoulders, elbows, wrist,  knee and ankles bilaterally.  Back Exam:  Inspection: Loss of lordosis Motion: Flexion 35 deg, Extension 25 deg, Side Bending to 45 deg bilaterally,  Rotation to 45 deg bilaterally  SLR laying: Positive right XSLR laying: Negative  Palpable tenderness: Tender to palpation the paraspinal musculature lumbar spine. FABER: Positive Faber on the right. Sensory change: Gross  sensation intact to all lumbar and sacral dermatomes.  Reflexes: 2+ at both patellar tendons, 2+ at achilles tendons, Babinski's downgoing.  Strength at foot  Plantar-flexion: 5/5 Dorsi-flexion: 5/5 Eversion: 5/5 Inversion: 5/5  Leg strength  Quad: 5/5 Hamstring: 5/5 Hip flexor: 5/5 Hip abductors: 3/5 on the right compared to the contralateral side Gait unremarkable.   Procedure: Real-time Ultrasound Guided Injection of right greater trochanteric bursitis secondary to patient's body habitus Device: GE Logiq Q7 Ultrasound guided injection is preferred based studies that show increased duration, increased effect, greater accuracy, decreased procedural pain, increased response rate, and decreased cost with ultrasound guided versus blind injection.  Verbal informed consent obtained.  Time-out conducted.  Noted no overlying erythema, induration, or other signs of local infection.  Skin prepped in a sterile fashion.  Local anesthesia: Topical Ethyl chloride.  With sterile technique and under real time ultrasound guidance:  Greater trochanteric area was visualized and patient's bursa was noted. A 22-gauge 3 inch needle was inserted and 4 cc of 0.5% Marcaine and 1 cc of Kenalog 40 mg/dL was injected. Pictures taken Completed without difficulty  Pain immediately resolved suggesting accurate placement of the medication.  Advised to call if fevers/chills, erythema, induration, drainage, or persistent bleeding.  Images permanently stored and available for review in the ultrasound unit.  Impression: Technically successful ultrasound guided injection.    Impression and Recommendations:     This case required medical decision making of moderate complexity.      Note: This dictation was prepared with Dragon dictation along with smaller phrase technology. Any transcriptional errors that result from this process are unintentional.

## 2017-10-27 ENCOUNTER — Ambulatory Visit (INDEPENDENT_AMBULATORY_CARE_PROVIDER_SITE_OTHER)
Admission: RE | Admit: 2017-10-27 | Discharge: 2017-10-27 | Disposition: A | Discharge status: Home / Self Care | Payer: Self-pay | Source: Ambulatory Visit | Attending: Family Medicine | Admitting: Family Medicine

## 2017-10-27 ENCOUNTER — Ambulatory Visit: Payer: Self-pay | Admitting: Family Medicine

## 2017-10-27 ENCOUNTER — Encounter: Payer: Self-pay | Admitting: Family Medicine

## 2017-10-27 ENCOUNTER — Ambulatory Visit: Payer: Self-pay

## 2017-10-27 VITALS — BP 150/92 | HR 80 | Ht 60.0 in | Wt 202.0 lb

## 2017-10-27 DIAGNOSIS — M5416 Radiculopathy, lumbar region: Secondary | ICD-10-CM

## 2017-10-27 DIAGNOSIS — M7061 Trochanteric bursitis, right hip: Secondary | ICD-10-CM

## 2017-10-27 HISTORY — DX: Trochanteric bursitis, right hip: M70.61

## 2017-10-27 NOTE — Patient Instructions (Signed)
Good to see you  Ice 20 minutes 2 times daily. Usually after activity and before bed. pennsaid pinkie amount topically 2 times daily as needed.  Exercises 3 times a week.  Xray downstairs today as well  See me again in 4 weeks. If not better we will look at the back again

## 2017-10-27 NOTE — Assessment & Plan Note (Signed)
Patient given an injection today.  I am concerned for more of a lumbar radiculopathy.  X-rays of the lumbar spine ordered today.  Does not think that this is likely more of the radicular symptoms.  Patient does not want to try the gabapentin at this time given home exercises.  Given trial of topical anti-inflammatories.  We discussed icing regimen.  Follow-up again in 3 weeks.  Worsening symptoms we will consider further evaluation of the back.

## 2017-11-25 ENCOUNTER — Ambulatory Visit: Payer: Self-pay | Admitting: Family Medicine

## 2017-12-25 ENCOUNTER — Encounter: Payer: Self-pay | Admitting: Internal Medicine

## 2017-12-25 ENCOUNTER — Ambulatory Visit (INDEPENDENT_AMBULATORY_CARE_PROVIDER_SITE_OTHER): Payer: Self-pay | Admitting: Internal Medicine

## 2017-12-25 VITALS — BP 142/90 | HR 76 | Temp 98.5°F | Ht 60.0 in | Wt 197.0 lb

## 2017-12-25 DIAGNOSIS — N390 Urinary tract infection, site not specified: Secondary | ICD-10-CM

## 2017-12-25 DIAGNOSIS — N3001 Acute cystitis with hematuria: Secondary | ICD-10-CM

## 2017-12-25 DIAGNOSIS — R300 Dysuria: Secondary | ICD-10-CM

## 2017-12-25 HISTORY — DX: Urinary tract infection, site not specified: N39.0

## 2017-12-25 LAB — POCT URINALYSIS DIPSTICK
BILIRUBIN UA: NEGATIVE
Glucose, UA: NEGATIVE
KETONES UA: NEGATIVE
Nitrite, UA: NEGATIVE
PH UA: 6 (ref 5.0–8.0)
Protein, UA: 15
RBC UA: 10
SPEC GRAV UA: 1.015 (ref 1.010–1.025)
UROBILINOGEN UA: 0.2 U/dL

## 2017-12-25 MED ORDER — SULFAMETHOXAZOLE-TRIMETHOPRIM 800-160 MG PO TABS: 1.0000 | ORAL_TABLET | Freq: Two times a day (BID) | ORAL | 0 refills | Status: DC

## 2017-12-25 NOTE — Assessment & Plan Note (Signed)
U/A in the office with signs of UTI. Rx for bactrim 5 day course.

## 2017-12-25 NOTE — Patient Instructions (Signed)
We have sent in the bactrim for you. Take 1 pill twice a day for 5 days.    Urinary Tract Infection, Adult A urinary tract infection (UTI) is an infection of any part of the urinary tract. The urinary tract includes the:  Kidneys.  Ureters.  Bladder.  Urethra.  These organs make, store, and get rid of pee (urine) in the body. Follow these instructions at home:  Take over-the-counter and prescription medicines only as told by your doctor.  If you were prescribed an antibiotic medicine, take it as told by your doctor. Do not stop taking the antibiotic even if you start to feel better.  Avoid the following drinks: ? Alcohol. ? Caffeine. ? Tea. ? Carbonated drinks.  Drink enough fluid to keep your pee clear or pale yellow.  Keep all follow-up visits as told by your doctor. This is important.  Make sure to: ? Empty your bladder often and completely. Do not to hold pee for long periods of time. ? Empty your bladder before and after sex. ? Wipe from front to back after a bowel movement if you are female. Use each tissue one time when you wipe. Contact a doctor if:  You have back pain.  You have a fever.  You feel sick to your stomach (nauseous).  You throw up (vomit).  Your symptoms do not get better after 3 days.  Your symptoms go away and then come back. Get help right away if:  You have very bad back pain.  You have very bad lower belly (abdominal) pain.  You are throwing up and cannot keep down any medicines or water. This information is not intended to replace advice given to you by your health care provider. Make sure you discuss any questions you have with your health care provider. Document Released: 03/03/2008 Document Revised: 02/21/2016 Document Reviewed: 08/06/2015 Elsevier Interactive Patient Education  [employer].

## 2017-12-25 NOTE — Progress Notes (Signed)
   Subjective:    Patient ID: Jill Shah, female    DOB: 05-Jul-1961, 57 y.o.   MRN: [id number]  HPI The patient is a 57 YO female coming in for possible UTI. Started Tuesday with problems voiding. She has had burning with peeing since that time. She does have pain in the lower stomach and around to the back. She denies fevers or chills. Denies SOB. Drinking more water and taking azo without relief. Overall worsening. Denies nausea or vomiting.   Review of Systems  Constitutional: Negative.   Respiratory: Negative.   Cardiovascular: Negative.   Gastrointestinal: Positive for abdominal pain. Negative for abdominal distention, constipation, diarrhea, nausea and vomiting.  Genitourinary: Positive for dysuria, frequency and urgency.  Musculoskeletal: Negative.   Skin: Negative.       Objective:   Physical Exam  Constitutional: She is oriented to person, place, and time. She appears well-developed and well-nourished.  HENT:  Head: Normocephalic and atraumatic.  Eyes: EOM are normal.  Neck: Normal range of motion.  Cardiovascular: Normal rate and regular rhythm.  Pulmonary/Chest: Effort normal and breath sounds normal. No respiratory distress. She has no wheezes. She has no rales.  Abdominal: Soft. Bowel sounds are normal. She exhibits no distension. There is tenderness. There is no rebound.  Suprapubic tenderness  Neurological: She is alert and oriented to person, place, and time. Coordination normal.  Skin: Skin is warm and dry.   Vitals:   12/25/17 1427  BP: (!) 142/90  Pulse: 76  Temp: 98.5 F (36.9 C)  TempSrc: Oral  SpO2: 96%  Weight: 197 lb (89.4 kg)  Height: 5' (1.524 m)      Assessment & Plan:

## 2018-06-01 ENCOUNTER — Ambulatory Visit: Payer: Self-pay | Admitting: *Deleted

## 2018-06-01 ENCOUNTER — Emergency Department (HOSPITAL_COMMUNITY)
Admission: EM | Admit: 2018-06-01 | Discharge: 2018-06-02 | Disposition: A | Discharge status: Home / Self Care | Payer: BLUE CROSS/BLUE SHIELD | Attending: Emergency Medicine | Admitting: Emergency Medicine

## 2018-06-01 ENCOUNTER — Encounter (HOSPITAL_COMMUNITY): Payer: Self-pay | Admitting: Emergency Medicine

## 2018-06-01 ENCOUNTER — Other Ambulatory Visit: Payer: Self-pay

## 2018-06-01 DIAGNOSIS — I10 Essential (primary) hypertension: Secondary | ICD-10-CM | POA: Insufficient documentation

## 2018-06-01 DIAGNOSIS — Z79899 Other long term (current) drug therapy: Secondary | ICD-10-CM | POA: Diagnosis not present

## 2018-06-01 DIAGNOSIS — M79604 Pain in right leg: Secondary | ICD-10-CM | POA: Insufficient documentation

## 2018-06-01 NOTE — Telephone Encounter (Signed)
Pt called with having swelling in her left leg, from her ankle to her mid thigh. She said that she noticed this last evening,.  She states it is very painful to touch and very difficult to walk. She can see "a blood clot" on her leg. She has been taking an ASA everyday and elevating it and putting ice on it but has nothing as helped.  She had a problem with swelling with this leg about a month ago. But it is larger this time. She denies shortness of breath or chest pain, or fever. She stated her foot is not affected, but difficult to walk.  Per protocol, pt is going to the  emergency department at  [facility]. Her husband will drive here.  Reason for Disposition . [1] Can't walk or can barely walk AND [2] new onset  Answer Assessment - Initial Assessment Questions 1. ONSET: "When did the swelling start?" (e.g., minutes, hours, days)     Last night from, a month ago 2. LOCATION: "What part of the leg is swollen?"  "Are both legs swollen or just one leg?"     Mid thigh all the way down to the ankle 3. SEVERITY: "How bad is the swelling?" (e.g., localized; mild, moderate, severe)  - Localized - small area of swelling localized to one leg  - MILD pedal edema - swelling limited to foot and ankle, pitting edema < 1/4 inch (6 mm) deep, rest and elevation eliminate most or all swelling  - MODERATE edema - swelling of lower leg to knee, pitting edema > 1/4 inch (6 mm) deep, rest and elevation only partially reduce swelling  - SEVERE edema - swelling extends above knee, facial or hand swelling present      severe 4. REDNESS: "Does the swelling look red or infected?"     discolored 5. PAIN: "Is the swelling painful to touch?" If so, ask: "How painful is it?"   (Scale 1-10; mild, moderate or severe)     Pain # 15 6. FEVER: "Do you have a fever?" If so, ask: "What is it, how was it measured, and when did it start?"      no 7. CAUSE: "What do you think is causing the leg swelling?"     Not sure 8.  MEDICAL HISTORY: "Do you have a history of heart failure, kidney disease, liver failure, or cancer?"     no 9. RECURRENT SYMPTOM: "Have you had leg swelling before?" If so, ask: "When was the last time?" "What happened that time?"     Yes this happens before, kept leg elevated and drink a lot of water and took asa and put ice on it 10. OTHER SYMPTOMS: "Do you have any other symptoms?" (e.g., chest pain, difficulty breathing)       no  Protocols used: LEG SWELLING AND EDEMA-A-AH

## 2018-06-01 NOTE — ED Triage Notes (Signed)
Patient is complaining left knee pain. Patient states that she has a hx of blood clots and every time her dr tells her to go to the ED. Patient states that her leg started swelling for a month. Patient states that she noticed it got bigger last night. Patient states she has been putting ice on it.

## 2018-06-02 ENCOUNTER — Inpatient Hospital Stay (HOSPITAL_COMMUNITY): Admission: RE | Admit: 2018-06-02 | Payer: BLUE CROSS/BLUE SHIELD | Source: Ambulatory Visit

## 2018-06-02 ENCOUNTER — Ambulatory Visit (HOSPITAL_BASED_OUTPATIENT_CLINIC_OR_DEPARTMENT_OTHER)
Admission: RE | Admit: 2018-06-02 | Discharge: 2018-06-02 | Disposition: A | Discharge status: Home / Self Care | Payer: BLUE CROSS/BLUE SHIELD | Source: Ambulatory Visit | Attending: Emergency Medicine | Admitting: Emergency Medicine

## 2018-06-02 DIAGNOSIS — R609 Edema, unspecified: Secondary | ICD-10-CM | POA: Diagnosis not present

## 2018-06-02 MED ORDER — NAPROXEN 500 MG PO TABS
500.0000 mg | ORAL_TABLET | Freq: Once | ORAL | Status: AC
  Administered 2018-06-02: 500 mg via ORAL
  Filled 2018-06-02: qty 1

## 2018-06-02 MED ORDER — ACETAMINOPHEN ER 650 MG PO TBCR: 650.0000 mg | EXTENDED_RELEASE_TABLET | Freq: Three times a day (TID) | ORAL | 0 refills | Status: DC | PRN

## 2018-06-02 MED ORDER — ACETAMINOPHEN 325 MG PO TABS
650.0000 mg | ORAL_TABLET | Freq: Once | ORAL | Status: AC
Start: 2018-06-02 — End: 2018-06-02
  Administered 2018-06-02: 650 mg via ORAL
  Filled 2018-06-02: qty 2

## 2018-06-02 MED ORDER — ACETAMINOPHEN 325 MG PO TABS
ORAL_TABLET | ORAL | Status: AC
  Filled 2018-06-02: qty 1

## 2018-06-02 MED ORDER — NAPROXEN 375 MG PO TABS: 375.0000 mg | ORAL_TABLET | Freq: Two times a day (BID) | ORAL | 0 refills | Status: DC

## 2018-06-02 NOTE — Telephone Encounter (Signed)
Went to ED

## 2018-06-02 NOTE — Progress Notes (Signed)
Left lower extremity venous duplex completed. Preliminary results - There is no evidence of a DVT or Baker's cyst. [place] D. [place], [place] 06/02/2018 11:56 AM

## 2018-06-02 NOTE — ED Provider Notes (Signed)
[facility] DEPT Provider Note   CSN: [id number] Arrival date & time: 06/01/18  2043     History   Chief Complaint Chief Complaint  Patient presents with  . Leg Pain    HPI Jill Shah is a 58 y.o. female.  HPI 57 year old female comes in with chief complaint of left lower extremity pain and swelling. Patient reports that she had swelling in her left leg about a month ago that eventually improved on its own.  Patient had a couple of knots around her knee which also resolved.  However, couple of days ago patient redeveloped swelling to her left leg with associated pain.  Patient denies any trauma.  She has no history of DVT, diagnosis of venous stasis.  She also denies any redness, fevers, chills.  Pt has no hx of PE, DVT and denies any exogenous hormone (testosterone / estrogen) use, long distance travels or surgery in the past 6 weeks, active cancer, recent immobilization.  She denies a diagnosis of DVT.  Past Medical History:  Diagnosis Date  . Hypercholesterolemia   . Hypertension     Patient Active Problem List   Diagnosis Date Noted  . UTI (urinary tract infection) 12/25/2017  . Greater trochanteric bursitis, right 10/27/2017  . Lumbar radiculopathy 01/27/2017  . 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 Left shoulder pain 09/09/2016  . Trochanteric bursitis of left hip 09/09/2016  . Lateral epicondylitis of right elbow 11/19/2015  . 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 Right elbow pain 11/08/2015  . HYPERCHOLESTEROLEMIA 07/11/2010  . OBESITY 07/11/2010  . HYPERTENSION, BENIGN ESSENTIAL 07/11/2010  . 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 CESAREAN SECTION     x2  . fibroid ablation    . TUBAL LIGATION       OB History   None      Home Medications    Prior to Admission medications   Medication Sig Start Date End Date Taking?  Authorizing Provider  acetaminophen (TYLENOL 8 HOUR) 650 MG CR tablet Take 1 tablet (650 mg total) by mouth every 8 (eight) hours as needed. 06/02/18   Varney Biles, MD  cyclobenzaprine (FLEXERIL) 10 MG tablet Take 1 tablet (10 mg total) by mouth 2 (two) times daily as needed for muscle spasms. Patient not taking: Reported on 12/25/2017 07/21/17   McDonald, Maree Erie A, PA-C  Ferrous Sulfate (IRON) 325 (65 FE) MG TABS Take 325 mg by mouth 2 (two) times daily.     [provider]  naproxen (NAPROSYN) 375 MG tablet Take 1 tablet (375 mg total) by mouth 2 (two) times daily. 06/02/18   Varney Biles, MD  sulfamethoxazole-trimethoprim (BACTRIM DS,SEPTRA DS) 800-160 MG tablet Take 1 tablet by mouth 2 (two) times daily. 12/25/17   Hoyt Koch, MD    Family History Family History  Problem Relation Age of Onset  . Diabetes Mother   . Lung cancer Mother   . Pancreatic cancer Sister   . Other Brother        aids  . Colon cancer Neg Hx   . Colon polyps Neg Hx     Social History Social History   Tobacco Use  . Smoking status: Never Smoker  . Smokeless tobacco: Never Used  Substance Use Topics  . Alcohol use: No    Alcohol/week: 0.0 standard drinks  . Drug use: No     Allergies   Patient has no known  allergies.   Review of Systems Review of Systems  Constitutional: Positive for activity change. Negative for fever.  Respiratory: Negative for shortness of breath.   Cardiovascular: Negative for chest pain.  Musculoskeletal: Positive for arthralgias and myalgias.  Allergic/Immunologic: Negative for immunocompromised state.  Hematological: Does not bruise/bleed easily.  All other systems reviewed and are negative.    Physical Exam Updated Vital Signs BP (!) 186/101 (BP Location: Left Arm)   Pulse 71   Temp 98.6 F (37 C) (Oral)   Resp 20   Ht 5' (1.524 m)   Wt 89.8 kg   SpO2 98%   BMI 38.67 kg/m   Physical Exam  Constitutional: She is oriented to person,  place, and time. She appears well-developed.  HENT:  Head: Normocephalic and atraumatic.  Eyes: EOM are normal.  Neck: Normal range of motion. Neck supple.  Cardiovascular: Normal rate.  Pulmonary/Chest: Effort normal.  Abdominal: Bowel sounds are normal.  Musculoskeletal: She exhibits edema.  Unilateral swelling to left lower extremity.  Patient has tenderness to palpation over the calf region.  Range of motion over the knee is slightly compromised, however there is no rubor, calor to the extremity over the joint.  Neurological: She is alert and oriented to person, place, and time.  Skin: Skin is warm and dry.  Nursing note and vitals reviewed.    ED Treatments / Results  Labs (all labs ordered are listed, but only abnormal results are displayed) Labs Reviewed - No data to display  EKG None  Radiology No results found.  Procedures Procedures (including critical care time)  Medications Ordered in ED Medications  naproxen (NAPROSYN) tablet 500 mg (has no administration in time range)  acetaminophen (TYLENOL) tablet 650 mg (has no administration in time range)     Initial Impression / Assessment and Plan / ED Course  I have reviewed the triage vital signs and the nursing notes.  Pertinent labs & imaging results that were available during my care of the patient were reviewed by me and considered in my medical decision making (see chart for details).     57 year old female comes in with chief complaint of unilateral left lower extremity swelling and pain. There is no evidence of infection, fracture/dislocation.  Range of motion over the knee and ankle is also intact.  There is no pitting with this swelling.  Clinical concern is high for DVT. Ultrasound lower extremity has been ordered.  Patient is advised to follow-up with instructions provided on the discharge paperwork.  Final Clinical Impressions(s) / ED Diagnoses   Final diagnoses:  Right leg pain    ED  Discharge Orders         Ordered    LE VENOUS     06/02/18 0015    naproxen (NAPROSYN) 375 MG tablet  2 times daily     06/02/18 0018    acetaminophen (TYLENOL 8 HOUR) 650 MG CR tablet  Every 8 hours PRN     06/02/18 0018           Varney Biles, MD 06/02/18 0022

## 2018-06-02 NOTE — Discharge Instructions (Addendum)
We saw in the ER for leg swelling and pain. We have ordered ultrasound to look for blood clots.  Please follow the instructions provided to get the ultrasound completed tomorrow.

## 2018-06-03 ENCOUNTER — Other Ambulatory Visit (INDEPENDENT_AMBULATORY_CARE_PROVIDER_SITE_OTHER): Payer: BLUE CROSS/BLUE SHIELD

## 2018-06-03 ENCOUNTER — Encounter: Payer: Self-pay | Admitting: Internal Medicine

## 2018-06-03 ENCOUNTER — Ambulatory Visit (INDEPENDENT_AMBULATORY_CARE_PROVIDER_SITE_OTHER)
Admission: RE | Admit: 2018-06-03 | Discharge: 2018-06-03 | Disposition: A | Discharge status: Home / Self Care | Payer: BLUE CROSS/BLUE SHIELD | Source: Ambulatory Visit | Attending: Internal Medicine | Admitting: Internal Medicine

## 2018-06-03 ENCOUNTER — Ambulatory Visit: Payer: BLUE CROSS/BLUE SHIELD | Admitting: Internal Medicine

## 2018-06-03 VITALS — BP 140/90 | HR 71 | Temp 98.7°F | Ht 60.0 in | Wt 189.0 lb

## 2018-06-03 DIAGNOSIS — M25562 Pain in left knee: Secondary | ICD-10-CM | POA: Diagnosis not present

## 2018-06-03 DIAGNOSIS — M7989 Other specified soft tissue disorders: Secondary | ICD-10-CM

## 2018-06-03 HISTORY — DX: Pain in left knee: M25.562

## 2018-06-03 HISTORY — DX: Other specified soft tissue disorders: M79.89

## 2018-06-03 LAB — CBC
HCT: 37.2 % (ref 36.0–46.0)
Hemoglobin: 12.2 g/dL (ref 12.0–15.0)
MCHC: 32.9 g/dL (ref 30.0–36.0)
MCV: 83.7 fl (ref 78.0–100.0)
Platelets: 263 10*3/uL (ref 150.0–400.0)
RBC: 4.44 Mil/uL (ref 3.87–5.11)
RDW: 15.8 % — AB (ref 11.5–15.5)
WBC: 5.1 10*3/uL (ref 4.0–10.5)

## 2018-06-03 LAB — COMPREHENSIVE METABOLIC PANEL
ALBUMIN: 4.2 g/dL (ref 3.5–5.2)
ALT: 11 U/L (ref 0–35)
AST: 14 U/L (ref 0–37)
Alkaline Phosphatase: 62 U/L (ref 39–117)
BUN: 11 mg/dL (ref 6–23)
CO2: 30 meq/L (ref 19–32)
Calcium: 9.2 mg/dL (ref 8.4–10.5)
Chloride: 104 mEq/L (ref 96–112)
Creatinine, Ser: 0.81 mg/dL (ref 0.40–1.20)
GFR: 93.96 mL/min (ref >60.00)
GLUCOSE: 94 mg/dL (ref 70–99)
POTASSIUM: 4.1 meq/L (ref 3.5–5.1)
SODIUM: 141 meq/L (ref 135–145)
TOTAL PROTEIN: 7.4 g/dL (ref 6.0–8.3)
Total Bilirubin: 0.4 mg/dL (ref 0.2–1.2)

## 2018-06-03 LAB — BRAIN NATRIURETIC PEPTIDE: PRO B NATRI PEPTIDE: 19 pg/mL (ref 0.0–100.0)

## 2018-06-03 NOTE — Assessment & Plan Note (Signed)
Korea negative for DVT or baker's cyst. No explanation for why she might have tendon sprain or strain. Checking x-ray knee today as well as ruling out alternative cause of the swelling for liver, kidney, heart dysfunction. Given rx for compression stockings today.

## 2018-06-03 NOTE — Patient Instructions (Signed)
We are checking the blood work and x-ray today.   We have given you the compression stocking to help with swelling.

## 2018-06-03 NOTE — Progress Notes (Signed)
   Subjective:    Patient ID: Jill Shah, female    DOB: 02-14-1961, 57 y.o.   MRN: [id number]  HPI The patient is a 57 YO female coming in for left leg swelling and ER follow up. Started about 2-3 days ago. Went to ER 2 days ago and got Korea to rule out DVT yesterday which was negative. She was given naproxen for pain which she has tried and is somewhat helpful but not working as well today. Overall it is stable. Denies diet changes. Over the summer she has had this pain in the leg and swelling a few times and the first time it went away on its own in several days. This is not going away.   Review of Systems  Constitutional: Positive for activity change. Negative for appetite change, diaphoresis and fatigue.  HENT: Negative.   Eyes: Negative.   Respiratory: Negative for cough, chest tightness and shortness of breath.   Cardiovascular: Positive for leg swelling. Negative for chest pain and palpitations.  Gastrointestinal: Negative for abdominal distention, abdominal pain, constipation, diarrhea, nausea and vomiting.  Musculoskeletal: Positive for arthralgias, joint swelling and myalgias.  Skin: Negative.   Neurological: Negative.   Psychiatric/Behavioral: Negative.       Objective:   Physical Exam  Constitutional: She is oriented to person, place, and time. She appears well-developed and well-nourished.  HENT:  Head: Normocephalic and atraumatic.  Eyes: EOM are normal.  Neck: Normal range of motion.  Cardiovascular: Normal rate and regular rhythm.  Pulmonary/Chest: Effort normal and breath sounds normal. No respiratory distress. She has no wheezes. She has no rales.  Abdominal: Soft. Bowel sounds are normal. She exhibits no distension. There is no tenderness. There is no rebound.  Musculoskeletal: She exhibits edema and tenderness.  1+ edema in the left leg from ankle to knee, pain on the medial knee and calf. No rash or skin color change. No visible vein cords or  varicosities.   Neurological: She is alert and oriented to person, place, and time. Coordination normal.  Skin: Skin is warm and dry.  Psychiatric: She has a normal mood and affect.   Vitals:   06/03/18 0912  BP: 140/90  Pulse: 71  Temp: 98.7 F (37.1 C)  TempSrc: Oral  SpO2: 96%  Weight: 189 lb (85.7 kg)  Height: 5' (1.524 m)      Assessment & Plan:

## 2018-06-03 NOTE — Assessment & Plan Note (Signed)
Checking x-ray left knee for any changes. She has no reason for any tendon tear or sprain. If no resolution in 1-2 weeks needs to see sports medicine for Korea evaluation of knee tendons. US done to rule out DVT or baker's cyst by ER which was negative for those findings.

## 2018-06-11 ENCOUNTER — Ambulatory Visit: Payer: BLUE CROSS/BLUE SHIELD | Admitting: Family Medicine

## 2018-06-13 NOTE — Progress Notes (Signed)
Jill Shah - 57 y.o. female MRN [id number]  Date of birth: Jun 22, 1961  SUBJECTIVE:  Including CC & ROS.  Chief Complaint  Patient presents with  . Initial Assessment    L leg and knee pain    Jill Shah is a 57 y.o. female that is presenting w/ c/o L leg and knee pain x the past few months.  Pt went to ED on 06/01/18 for similar symptoms and an Korea was performed to r/o a blood clot in her L LE which was neg.  Pt most recently f/u w/ Dr. Sharlet Salina and 06/03/18.  Pt states that she has constant 10/10 pain in her L post LE and medial knee.  She notes that she is now having pain in her L lower back as well.  Pt denies any current n/t in her L LE and denies any mechanical symptoms in her L knee.  Pt reports pain at night w/ sleep disturbance.  Aggravating factors: walking, rolling in bed, prolonged sitting Alleviating factors: nothing noted   Pt states that she tried taking Aleve but that no longer helps w/ the pain.  Independent review of the left knee x-ray from 9/5 shows mild degenerative changes.  Independent review of the lumbar spine x-ray from 1/29 shows fusion at L4-5.   Review of Systems  Constitutional: Negative for fever.  HENT: Negative for congestion.   Respiratory: Negative for cough.   Cardiovascular: Negative for chest pain.  Gastrointestinal: Negative for abdominal pain.  Musculoskeletal: Positive for gait problem.  Skin: Negative for color change.  Neurological: Negative for weakness.  Hematological: Negative for adenopathy.  Psychiatric/Behavioral: Negative for agitation.    HISTORY: Past Medical, Surgical, Social, and Family History Reviewed & Updated per EMR.   Pertinent Historical Findings include:  Past Medical History:  Diagnosis Date  . Hypercholesterolemia   . Hypertension     Past Surgical History:  Procedure Laterality Date  . CESAREAN SECTION     x2  . fibroid ablation    . TUBAL LIGATION      No Known Allergies  Family  History  Problem Relation Age of Onset  . Diabetes Mother   . Lung cancer Mother   . Pancreatic cancer Sister   . Other Brother        aids  . Colon cancer Neg Hx   . Colon polyps Neg Hx      Social History   Socioeconomic History  . Marital status: Married    Spouse name: Not on file  . Number of children: Not on file  . Years of education: Not on file  . Highest education level: Not on file  Occupational History  . Not on file  Social Needs  . Financial resource strain: Not on file  . Food insecurity:    Worry: Not on file    Inability: Not on file  . Transportation needs:    Medical: Not on file    Non-medical: Not on file  Tobacco Use  . Smoking status: Never Smoker  . Smokeless tobacco: Never Used  Substance and Sexual Activity  . Alcohol use: No    Alcohol/week: 0.0 standard drinks  . Drug use: No  . Sexual activity: Not on file  Lifestyle  . Physical activity:    Days per week: Not on file    Minutes per session: Not on file  . Stress: Not on file  Relationships  . Social connections:    Talks on phone: Not on  file    Gets together: Not on file    Attends religious service: Not on file    Active member of club or organization: Not on file    Attends meetings of clubs or organizations: Not on file    Relationship status: Not on file  . Intimate partner violence:    Fear of current or ex partner: Not on file    Emotionally abused: Not on file    Physically abused: Not on file    Forced sexual activity: Not on file  Other Topics Concern  . Not on file  Social History Narrative  . Not on file     PHYSICAL EXAM:  VS: BP 140/82 (BP Location: Left Arm, Patient Position: Sitting, Cuff Size: Normal)   Pulse 82   Ht 5' (1.524 m)   Wt 191 lb 12.8 oz (87 kg)   SpO2 97%   BMI 37.46 kg/m  Physical Exam Gen: NAD, alert, cooperative with exam, well-appearing ENT: normal lips, normal nasal mucosa,  Eye: normal EOM, normal conjunctiva and lids CV:  no  edema, +2 pedal pulses   Resp: no accessory muscle use, non-labored,  Skin: no rashes, no areas of induration  Neuro: normal tone, normal sensation to touch Psych:  normal insight, alert and oriented MSK:  Back: No tenderness palpation over the piriformis or greater trochanter. No significant tenderness over the SI joint. Negative straight leg raise. Left knee: Normal to inspection with no erythema or effusion or obvious bony abnormalities. Palpation normal with no warmth Mild pain with patellar compression. No significant tenderness over the medial lateral joint line. ROM full in flexion and extension and lower leg rotation. Ligaments with solid consistent endpoints including  LCL, MCL. Negative Mcmurray's tests. Non painful patellar compression. Patellar glide without crepitus. Patellar and quadriceps tendons unremarkable. Hamstring and quadriceps strength is normal.  Neurovascularly intact        ASSESSMENT & PLAN:   Lumbar radiculopathy Seems to have a component of radiculopathy down the left leg.  - IM Depo-Provera -Counseled on home exercise therapy. -Counseled on supportive care. -If no improvement will consider physical therapy versus trigger point injections.  Left knee pain Likely has component of patellofemoral syndrome with the pain she is experiencing.  No significant degenerative changes on the x-ray. -Counseled on home exercise therapy -If no improvement consider injection and physical therapy.

## 2018-06-14 ENCOUNTER — Encounter: Payer: Self-pay | Admitting: Family Medicine

## 2018-06-14 ENCOUNTER — Ambulatory Visit: Payer: BLUE CROSS/BLUE SHIELD | Admitting: Family Medicine

## 2018-06-14 VITALS — BP 140/82 | HR 82 | Ht 60.0 in | Wt 191.8 lb

## 2018-06-14 DIAGNOSIS — M5416 Radiculopathy, lumbar region: Secondary | ICD-10-CM

## 2018-06-14 DIAGNOSIS — M25562 Pain in left knee: Secondary | ICD-10-CM

## 2018-06-14 DIAGNOSIS — M5137 Other intervertebral disc degeneration, lumbosacral region: Secondary | ICD-10-CM

## 2018-06-14 MED ORDER — METHYLPREDNISOLONE ACETATE 40 MG/ML IJ SUSP
40.0000 mg | Freq: Once | INTRAMUSCULAR | Status: AC
  Administered 2018-06-14: 40 mg via INTRAMUSCULAR

## 2018-06-14 MED ORDER — PREDNISONE 5 MG PO TABS: ORAL_TABLET | ORAL | 0 refills | Status: DC

## 2018-06-14 NOTE — Patient Instructions (Signed)
Nice to meet you  Please try the exercises  Please try heat or ice  Please try rubbing Aspercreme with lidocaine on the area. Please see me back in 3 to 4 weeks if you feel like your symptoms are not improving.

## 2018-06-16 NOTE — Assessment & Plan Note (Signed)
Likely has component of patellofemoral syndrome with the pain she is experiencing.  No significant degenerative changes on the x-ray. -Counseled on home exercise therapy -If no improvement consider injection and physical therapy.

## 2018-06-16 NOTE — Assessment & Plan Note (Signed)
Seems to have a component of radiculopathy down the left leg.  - IM Depo-Provera -Counseled on home exercise therapy. -Counseled on supportive care. -If no improvement will consider physical therapy versus trigger point injections.

## 2018-06-24 ENCOUNTER — Ambulatory Visit: Payer: BLUE CROSS/BLUE SHIELD | Admitting: Family Medicine

## 2018-07-14 NOTE — Progress Notes (Signed)
[facility] [place], [place] [address] Phone: [phone] Subjective:     CC: Back pain and left leg pain  RWE:[id number]  Jill Shah is a 57 y.o. female coming in with complaint of back pain and left leg pain.  Patient has seen other providers recently.  Patient feels that the left leg is worsening though.  Has back pain as well quite some time.  Has had this on and off for quite some time.  Past medical history is significant for fusion at L4-L5.  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  Patient states that it is becoming more difficult where patient is unable to go up with the stairs regularly.   Patient has had this leg pain that seems to be worsening over the course last month.  Did have a Doppler done in September that was unremarkable for any clot.  Past Medical History:  Diagnosis Date  . Hypercholesterolemia   . Hypertension    Past Surgical History:  Procedure Laterality Date  . CESAREAN SECTION     x2  . fibroid ablation    . TUBAL LIGATION     Social History   Socioeconomic History  . Marital status: Married    Spouse name: Not on file  . Number of children: Not on file  . Years of education: Not on file  . Highest education level: Not on file  Occupational History  . Not on file  Social Needs  . Financial resource strain: Not on file  . Food insecurity:    Worry: Not on file    Inability: Not on file  . Transportation needs:    Medical: Not on file    Non-medical: Not on file  Tobacco Use  . Smoking status: Never Smoker  . Smokeless tobacco: Never Used  Substance and Sexual Activity  . Alcohol use: No    Alcohol/week: 0.0 standard drinks  . Drug use: No  . Sexual activity: Not on file  Lifestyle  . Physical activity:    Days per week: Not on file    Minutes per session: Not on file  . Stress: Not on  file  Relationships  . Social connections:    Talks on phone: Not on file    Gets together: Not on file    Attends religious service: Not on file    Active member of club or organization: Not on file    Attends meetings of clubs or organizations: Not on file    Relationship status: Not on file  Other Topics Concern  . Not on file  Social History Narrative  . Not on file   No Known Allergies Family History  Problem Relation Age of Onset  . Diabetes Mother   . Lung cancer Mother   . Pancreatic cancer Sister   . Other Brother        aids  . Colon cancer Neg Hx   . Colon polyps Neg Hx     Current Outpatient Medications (Endocrine & Metabolic):  .  predniSONE (DELTASONE) 50 MG tablet, Take 1 tablet (50 mg total) by mouth daily.    Current Outpatient Medications (Analgesics):  .  acetaminophen (TYLENOL 8 HOUR) 650 MG CR tablet, Take 1 tablet (650 mg total) by mouth every 8 (eight) hours as needed. (Patient not taking: Reported on 06/14/2018) .  naproxen (NAPROSYN) 375 MG tablet, Take  1 tablet (375 mg total) by mouth 2 (two) times daily.  Current Outpatient Medications (Hematological):  Marland Kitchen  Ferrous Sulfate (IRON) 325 (65 FE) MG TABS, Take 325 mg by mouth 2 (two) times daily.   Current Outpatient Medications (Other):  .  gabapentin (NEURONTIN) 100 MG capsule, Take 2 capsules (200 mg total) by mouth at bedtime.    Past medical history, social, surgical and family history all reviewed in electronic medical record.  No pertanent information unless stated regarding to the chief complaint.   Review of Systems:  No headache, visual changes, nausea, vomiting, diarrhea, constipation, dizziness, abdominal pain, skin rash, fevers, chills, night sweats, weight loss, swollen lymph nodes, body aches, joint swelling,  chest pain, shortness of breath, mood changes.  Positive muscle aches  Objective  Blood pressure (!) 150/90, pulse 81, height 5' (1.524 m), weight 189 lb (85.7 kg), SpO2 98  %.    General: No apparent distress alert and oriented x3 mood and affect normal, dressed appropriately.  HEENT: Pupils equal, extraocular movements intact  Respiratory: Patient's speak in full sentences and does not appear short of breath  Cardiovascular: No lower extremity edema, non tender, no erythema  Skin: Warm dry intact with no signs of infection or rash on extremities or on axial skeleton.  Abdomen: Soft nontender  Neuro: Cranial nerves II through XII are intact, neurovascularly intact in all extremities with 2+ pulses.  Lymph: No lymphadenopathy of posterior or anterior cervical chain or axillae bilaterally.  Gait severely antalgic MSK:  tender with full range of motion and good stability and symmetric strength and tone of shoulders, elbows, wrist, hip, knee and ankles bilaterally.  Patient's left knee does not have significant amount of pain.  Patient does have good stability but a positive McMurray's.  Low back exam shows loss of lordosis.  Positive straight leg test on the left side at 15 degrees.  Patient does have 1+ DTR of the Achilles on the left side compared to 2+ on the right.  Patient does have weakness with dorsiflexion and plantarflexion.   Impression and Recommendations:     This case required medical decision making of moderate complexity. The above documentation has been reviewed and is accurate and complete Lyndal Pulley, DO       Note: This dictation was prepared with Dragon dictation along with smaller phrase technology. Any transcriptional errors that result from this process are unintentional.

## 2018-07-16 ENCOUNTER — Ambulatory Visit: Payer: BLUE CROSS/BLUE SHIELD | Admitting: Family Medicine

## 2018-07-16 ENCOUNTER — Encounter: Payer: Self-pay | Admitting: Family Medicine

## 2018-07-16 ENCOUNTER — Encounter: Payer: Self-pay | Admitting: *Deleted

## 2018-07-16 VITALS — BP 150/90 | HR 81 | Ht 60.0 in | Wt 189.0 lb

## 2018-07-16 DIAGNOSIS — M25562 Pain in left knee: Secondary | ICD-10-CM | POA: Diagnosis not present

## 2018-07-16 DIAGNOSIS — M5416 Radiculopathy, lumbar region: Secondary | ICD-10-CM | POA: Diagnosis not present

## 2018-07-16 DIAGNOSIS — Z23 Encounter for immunization: Secondary | ICD-10-CM

## 2018-07-16 MED ORDER — METHYLPREDNISOLONE ACETATE 80 MG/ML IJ SUSP
80.0000 mg | Freq: Once | INTRAMUSCULAR | Status: AC
  Administered 2018-07-16: 80 mg via INTRAMUSCULAR

## 2018-07-16 MED ORDER — GABAPENTIN 100 MG PO CAPS: 200.0000 mg | ORAL_CAPSULE | Freq: Every day | ORAL | 3 refills | Status: DC

## 2018-07-16 MED ORDER — KETOROLAC TROMETHAMINE 60 MG/2ML IM SOLN
60.0000 mg | Freq: Once | INTRAMUSCULAR | Status: AC
  Administered 2018-07-16: 60 mg via INTRAMUSCULAR

## 2018-07-16 MED ORDER — PREDNISONE 50 MG PO TABS: 50.0000 mg | ORAL_TABLET | Freq: Every day | ORAL | 0 refills | Status: DC

## 2018-07-16 NOTE — Patient Instructions (Addendum)
Good to see you  2 injections today  Prednisone daily for 5 days starting tomorrow Gabapentin 200mg  at night Ice 20 minutes 2 times daily. Usually after activity and before bed. MRI ordered and call [phone]  See me again ( ok  double book)

## 2018-07-16 NOTE — Assessment & Plan Note (Addendum)
Severe.  Patient does have weakness of the lower extremity, decrease in deep tendon reflexes.  Positive straight leg test at 15 degrees.  Concern for a severe herniated disc.  Patient is having no bowel or bladder incontinence.  MRI of the lumbar spine ordered today for further evaluation and patient would be a candidate for potential injections such as epidural or nerve root.  Patient given Toradol and Depo-Medrol today.  Prednisone and gabapentin ordered.  Following up in 1 to 2 weeks worsening pain will go to the emergency room.  Discussed the potential for other laboratory work-up which patient declined.  We also discussed because of the amount pain to the lower extremity with another Doppler would be beneficial which patient also declined.

## 2018-07-24 ENCOUNTER — Ambulatory Visit
Admission: RE | Admit: 2018-07-24 | Discharge: 2018-07-24 | Disposition: A | Discharge status: Home / Self Care | Payer: BLUE CROSS/BLUE SHIELD | Source: Ambulatory Visit | Attending: Family Medicine | Admitting: Family Medicine

## 2018-07-24 DIAGNOSIS — M5416 Radiculopathy, lumbar region: Secondary | ICD-10-CM

## 2018-07-24 DIAGNOSIS — M48061 Spinal stenosis, lumbar region without neurogenic claudication: Secondary | ICD-10-CM | POA: Diagnosis not present

## 2018-07-25 NOTE — Progress Notes (Signed)
[facility] [address], [place] [address] Phone: [phone] Subjective:    I Jill Shah am serving as a Education administrator for Dr. Hulan Saas.   I'm seeing this patient by the request  of:    CC: Back pain  JFH:[id number]  Jill Shah is a 57 y.o. female coming in with complaint of back pain. Here to talk about MRI results today.       Patient did have MRI of the back done yesterday.  This is independently visualized by me.  Severe spinal stenosis noted at multiple levels.  L3-L4 and does have L4 nerve root impingement left greater than right.  New left-sided S1 nerve root and bilateral L5 stenosis with severe right S1 level stenosis  Past Medical History:  Diagnosis Date  . Hypercholesterolemia   . Hypertension    Past Surgical History:  Procedure Laterality Date  . CESAREAN SECTION     x2  . fibroid ablation    . TUBAL LIGATION     Social History   Socioeconomic History  . Marital status: Married    Spouse name: Not on file  . Number of children: Not on file  . Years of education: Not on file  . Highest education level: Not on file  Occupational History  . Not on file  Social Needs  . Financial resource strain: Not on file  . Food insecurity:    Worry: Not on file    Inability: Not on file  . Transportation needs:    Medical: Not on file    Non-medical: Not on file  Tobacco Use  . Smoking status: Never Smoker  . Smokeless tobacco: Never Used  Substance and Sexual Activity  . Alcohol use: No    Alcohol/week: 0.0 standard drinks  . Drug use: No  . Sexual activity: Not on file  Lifestyle  . Physical activity:    Days per week: Not on file    Minutes per session: Not on file  . Stress: Not on file  Relationships  . Social connections:    Talks on phone: Not on file    Gets together: Not on file    Attends religious service: Not on file    Active member of club or organization: Not on file    Attends  meetings of clubs or organizations: Not on file    Relationship status: Not on file  Other Topics Concern  . Not on file  Social History Narrative  . Not on file   No Known Allergies Family History  Problem Relation Age of Onset  . Diabetes Mother   . Lung cancer Mother   . Pancreatic cancer Sister   . Other Brother        aids  . Colon cancer Neg Hx   . Colon polyps Neg Hx     Current Outpatient Medications (Endocrine & Metabolic):  .  predniSONE (DELTASONE) 50 MG tablet, Take 1 tablet (50 mg total) by mouth daily.    Current Outpatient Medications (Analgesics):  .  acetaminophen (TYLENOL 8 HOUR) 650 MG CR tablet, Take 1 tablet (650 mg total) by mouth every 8 (eight) hours as needed. .  naproxen (NAPROSYN) 375 MG tablet, Take 1 tablet (375 mg total) by mouth 2 (two) times daily.  Current Outpatient Medications (Hematological):  Marland Kitchen  Ferrous Sulfate (IRON) 325 (65 FE) MG TABS, Take 325 mg by mouth 2 (two) times daily.   Current Outpatient Medications (Other):  .  gabapentin (NEURONTIN) 100 MG capsule, Take 2 capsules (200 mg total) by mouth at bedtime.    Past medical history, social, surgical and family history all reviewed in electronic medical record.  No pertanent information unless stated regarding to the chief complaint.   Review of Systems:  No headache, visual changes, nausea, vomiting, diarrhea, constipation, dizziness, abdominal pain, skin rash, fevers, chills, night sweats, weight loss, swollen lymph nodes, body aches, joint swelling, muscle aches, chest pain, shortness of breath, mood changes.   Objective  Blood pressure (!) 160/84, pulse 78, height 5' (1.524 m), weight 176 lb (79.8 kg), SpO2 98 %.    General: No apparent distress alert and oriented x3 mood and affect normal, dressed appropriately.  Patient is very uncomfortable. HEENT: Pupils equal, extraocular movements intact  Respiratory: Patient's speak in full sentences and does not appear short of  breath  Cardiovascular: No lower extremity edema, non tender, no erythema  Skin: Warm dry intact with no signs of infection or rash on extremities or on axial skeleton.  Abdomen: Soft nontender  Neuro: Cranial nerves II through XII are intact, neurovascularly intact in all extremities with 2+ DTRs and 2+ pulses.  Lymph: No lymphadenopathy of posterior or anterior cervical chain or axillae bilaterally.  Gait antalgic MSK:  Non tender with full range of motion and good stability and symmetric strength and tone of shoulders, elbows, wrist, hip, knee and ankles bilaterally.  Back exam shows loss of lordosis.  Positive straight leg test at 15 degrees.  Does have 4 out of 5 strength in lower extremity patient unable to do the rest of the exam secondary to pain.   Impression and Recommendations:     This case required medical decision making of moderate complexity. The above documentation has been reviewed and is accurate and complete Jill Pulley, DO       Note: This dictation was prepared with Dragon dictation along with smaller phrase technology. Any transcriptional errors that result from this process are unintentional.

## 2018-07-26 ENCOUNTER — Encounter: Payer: Self-pay | Admitting: Family Medicine

## 2018-07-26 ENCOUNTER — Ambulatory Visit (INDEPENDENT_AMBULATORY_CARE_PROVIDER_SITE_OTHER): Payer: BLUE CROSS/BLUE SHIELD | Admitting: Family Medicine

## 2018-07-26 VITALS — BP 160/84 | HR 78 | Ht 60.0 in | Wt 176.0 lb

## 2018-07-26 DIAGNOSIS — G8929 Other chronic pain: Secondary | ICD-10-CM

## 2018-07-26 DIAGNOSIS — M5416 Radiculopathy, lumbar region: Secondary | ICD-10-CM | POA: Diagnosis not present

## 2018-07-26 DIAGNOSIS — M549 Dorsalgia, unspecified: Secondary | ICD-10-CM

## 2018-07-26 MED ORDER — PREDNISONE 50 MG PO TABS: 50.0000 mg | ORAL_TABLET | Freq: Every day | ORAL | 0 refills | Status: DC

## 2018-07-26 NOTE — Assessment & Plan Note (Signed)
Patient's MRI does correspond the patient's symptoms.  Severe multilevel degenerative changes with significant number of nerve impingements.  We will try potential for a epidural injection to see if patient would make improvement as well as see if that will help the surgeon with the possibility of intervention.  I do feel with the weakness noted in the severe amount of pain the patient will likely need surgical intervention.  Referral to neurosurgery has been years since she has followed up with them.  Discussed continuing the other medications at this time.  Worsening symptoms seek medical attention immediately.  Spent  25 minutes with patient face-to-face and had greater than 50% of counseling including as described above in assessment and plan.

## 2018-07-26 NOTE — Patient Instructions (Addendum)
Good to see you  Ice is your friend We will try to get epidural done at L3/4 and hope it helps Call them at [phone]  If you cannot get it done before the wedding take prednisone daily for 7 days .  I will refer you to neurosurgery to discuss your options

## 2018-07-28 ENCOUNTER — Telehealth: Payer: Self-pay

## 2018-07-28 ENCOUNTER — Ambulatory Visit
Admission: RE | Admit: 2018-07-28 | Discharge: 2018-07-28 | Disposition: A | Discharge status: Home / Self Care | Payer: BLUE CROSS/BLUE SHIELD | Source: Ambulatory Visit | Attending: Family Medicine | Admitting: Family Medicine

## 2018-07-28 DIAGNOSIS — G8929 Other chronic pain: Secondary | ICD-10-CM

## 2018-07-28 DIAGNOSIS — M5126 Other intervertebral disc displacement, lumbar region: Secondary | ICD-10-CM | POA: Diagnosis not present

## 2018-07-28 DIAGNOSIS — M549 Dorsalgia, unspecified: Principal | ICD-10-CM

## 2018-07-28 MED ORDER — METHYLPREDNISOLONE ACETATE 40 MG/ML INJ SUSP (RADIOLOG
120.0000 mg | Freq: Once | INTRAMUSCULAR | Status: AC
  Administered 2018-07-28: 120 mg via EPIDURAL

## 2018-07-28 MED ORDER — IOPAMIDOL (ISOVUE-M 200) INJECTION 41%
1.0000 mL | Freq: Once | INTRAMUSCULAR | Status: AC
  Administered 2018-07-28: 1 mL via EPIDURAL

## 2018-07-28 NOTE — Discharge Instructions (Signed)

## 2018-07-28 NOTE — Telephone Encounter (Signed)
-----   Message from Zachary M Smith, DO sent at 07/26/2018  8:34 AM EDT ----- Can we call patient.  Severe spinal stenosis at the levels around where she had surgery  Can try epidural first if she would like then order L3/4 to see if that helps   

## 2018-07-28 NOTE — Telephone Encounter (Signed)
Called patient and left a message to call back.

## 2018-07-28 NOTE — Telephone Encounter (Signed)
Patient returned call. Received epidural earlier today.

## 2018-08-09 DIAGNOSIS — M545 Low back pain: Secondary | ICD-10-CM | POA: Diagnosis not present

## 2018-08-10 NOTE — Telephone Encounter (Signed)
-----   Message from Lyndal Pulley, DO sent at 07/26/2018  8:34 AM EDT ----- Can we call patient.  Severe spinal stenosis at the levels around where she had surgery  Can try epidural first if she would like then order L3/4 to see if that helps

## 2018-08-23 ENCOUNTER — Ambulatory Visit: Payer: BLUE CROSS/BLUE SHIELD

## 2018-08-23 DIAGNOSIS — M9903 Segmental and somatic dysfunction of lumbar region: Secondary | ICD-10-CM | POA: Diagnosis not present

## 2018-08-23 DIAGNOSIS — M2560 Stiffness of unspecified joint, not elsewhere classified: Secondary | ICD-10-CM | POA: Diagnosis not present

## 2018-08-23 DIAGNOSIS — M545 Low back pain: Secondary | ICD-10-CM | POA: Diagnosis not present

## 2018-08-23 DIAGNOSIS — R293 Abnormal posture: Secondary | ICD-10-CM | POA: Diagnosis not present

## 2018-08-30 DIAGNOSIS — M545 Low back pain: Secondary | ICD-10-CM | POA: Diagnosis not present

## 2018-08-30 DIAGNOSIS — M2560 Stiffness of unspecified joint, not elsewhere classified: Secondary | ICD-10-CM | POA: Diagnosis not present

## 2018-08-30 DIAGNOSIS — M9903 Segmental and somatic dysfunction of lumbar region: Secondary | ICD-10-CM | POA: Diagnosis not present

## 2018-08-30 DIAGNOSIS — R293 Abnormal posture: Secondary | ICD-10-CM | POA: Diagnosis not present

## 2018-10-09 DIAGNOSIS — M9903 Segmental and somatic dysfunction of lumbar region: Secondary | ICD-10-CM | POA: Diagnosis not present

## 2018-10-09 DIAGNOSIS — R293 Abnormal posture: Secondary | ICD-10-CM | POA: Diagnosis not present

## 2018-10-09 DIAGNOSIS — M2560 Stiffness of unspecified joint, not elsewhere classified: Secondary | ICD-10-CM | POA: Diagnosis not present

## 2018-10-09 DIAGNOSIS — M545 Low back pain: Secondary | ICD-10-CM | POA: Diagnosis not present

## 2018-12-29 ENCOUNTER — Emergency Department (HOSPITAL_COMMUNITY): Payer: PRIVATE HEALTH INSURANCE

## 2018-12-29 ENCOUNTER — Other Ambulatory Visit: Payer: Self-pay

## 2018-12-29 ENCOUNTER — Emergency Department (HOSPITAL_COMMUNITY)
Admission: EM | Admit: 2018-12-29 | Discharge: 2018-12-29 | Disposition: A | Discharge status: Home / Self Care | Payer: PRIVATE HEALTH INSURANCE | Attending: Emergency Medicine | Admitting: Emergency Medicine

## 2018-12-29 ENCOUNTER — Ambulatory Visit: Payer: Self-pay | Admitting: *Deleted

## 2018-12-29 ENCOUNTER — Encounter (HOSPITAL_COMMUNITY): Payer: Self-pay | Admitting: Emergency Medicine

## 2018-12-29 DIAGNOSIS — I10 Essential (primary) hypertension: Secondary | ICD-10-CM | POA: Insufficient documentation

## 2018-12-29 DIAGNOSIS — R0789 Other chest pain: Secondary | ICD-10-CM | POA: Insufficient documentation

## 2018-12-29 DIAGNOSIS — Z79899 Other long term (current) drug therapy: Secondary | ICD-10-CM | POA: Insufficient documentation

## 2018-12-29 DIAGNOSIS — Z7982 Long term (current) use of aspirin: Secondary | ICD-10-CM | POA: Insufficient documentation

## 2018-12-29 LAB — HEPATIC FUNCTION PANEL
ALT: 15 U/L (ref 0–44)
AST: 19 U/L (ref 15–41)
Albumin: 3.9 g/dL (ref 3.5–5.0)
Alkaline Phosphatase: 63 U/L (ref 38–126)
Bilirubin, Direct: <0.1 mg/dL (ref 0.0–0.2)
Total Bilirubin: 0.5 mg/dL (ref 0.3–1.2)
Total Protein: 7.5 g/dL (ref 6.5–8.1)

## 2018-12-29 LAB — BASIC METABOLIC PANEL
Anion gap: 9 (ref 5–15)
BUN: 10 mg/dL (ref 6–20)
CO2: 28 mmol/L (ref 22–32)
Calcium: 8.8 mg/dL — ABNORMAL LOW (ref 8.9–10.3)
Chloride: 102 mmol/L (ref 98–111)
Creatinine, Ser: 0.83 mg/dL (ref 0.44–1.00)
GFR calc Af Amer: >60 mL/min (ref >60)
GFR calc non Af Amer: >60 mL/min (ref >60)
Glucose, Bld: 96 mg/dL (ref 70–99)
Potassium: 3.4 mmol/L — ABNORMAL LOW (ref 3.5–5.1)
Sodium: 139 mmol/L (ref 135–145)

## 2018-12-29 LAB — CBC
HCT: 37.3 % (ref 36.0–46.0)
Hemoglobin: 11.9 g/dL — ABNORMAL LOW (ref 12.0–15.0)
MCH: 27.9 pg (ref 26.0–34.0)
MCHC: 31.9 g/dL (ref 30.0–36.0)
MCV: 87.4 fL (ref 80.0–100.0)
Platelets: 252 10*3/uL (ref 150–400)
RBC: 4.27 MIL/uL (ref 3.87–5.11)
RDW: 15.3 % (ref 11.5–15.5)
WBC: 6.1 10*3/uL (ref 4.0–10.5)
nRBC: 0 % (ref 0.0–0.2)

## 2018-12-29 LAB — I-STAT BETA HCG BLOOD, ED (MC, WL, AP ONLY): I-stat hCG, quantitative: <5 m[IU]/mL (ref <5)

## 2018-12-29 LAB — TROPONIN I: Troponin I: <0.03 ng/mL (ref <0.03)

## 2018-12-29 MED ORDER — OMEPRAZOLE 20 MG PO CPDR: 20.0000 mg | DELAYED_RELEASE_CAPSULE | Freq: Every day | ORAL | 0 refills | Status: DC

## 2018-12-29 MED ORDER — ALUM & MAG HYDROXIDE-SIMETH 200-200-20 MG/5ML PO SUSP
30.0000 mL | Freq: Once | ORAL | Status: AC
  Administered 2018-12-29: 30 mL via ORAL
  Filled 2018-12-29: qty 30

## 2018-12-29 MED ORDER — LIDOCAINE VISCOUS HCL 2 % MT SOLN
15.0000 mL | Freq: Once | OROMUCOSAL | Status: AC
  Administered 2018-12-29: 15 mL via ORAL
  Filled 2018-12-29: qty 15

## 2018-12-29 NOTE — ED Notes (Signed)
Pt is alert and oriented x 4 and is verbally responsive. Pt is able to talk in full sentences without difficulty. Pt is breathing normal and unlabored.

## 2018-12-29 NOTE — Discharge Instructions (Addendum)
Your laboratory results today were normal.  Your chest x-ray was also normal.  I have provided a prescription for medication which should help with some of your symptoms.  We encourage you to follow-up with your primary care as needed.  If your symptoms worsen, you experience shortness of breath, chest pain please return to the emergency department.

## 2018-12-29 NOTE — ED Provider Notes (Signed)
[facility] DEPT Provider Note   CSN: [id number] Arrival date & time: 12/29/18  1401    History   Chief Complaint Chief Complaint  Patient presents with  . URI  . chest pain  . Shortness of Breath  . Cough    HPI Jill Shah is a 58 y.o. female.     58 y.o female with a PMH of HTN, HLD presents to the ED with a chief complaint URI symptoms x 1 week. Patient reports a pressure to the center of her chest which she scribes as a burning sensation.  States her pain is a 10 out of 10.  She has had the symptoms for the past week and thought they were allergies, states no relief with allergy medication.  Patient does report some epigastric abdominal pain, states this resolved but now her chest pain began.  She also reports feeling short of breath, this was at rest while watching TV 2 days ago.  She also endorses a dry cough, stating she "feels like I have to croon my throat ".  States she feels like taking a big deep breath makes this better.  She denies any cardiac history, family history of heart disease.  She denies fever, covid 19 exposure, previous history of blood clots.      Past Medical History:  Diagnosis Date  . Hypercholesterolemia   . Hypertension     Patient Active Problem List   Diagnosis Date Noted  . Left lumbar radiculopathy 07/16/2018  . Left leg swelling 06/03/2018  . Left knee pain 06/03/2018  . UTI (urinary tract infection) 12/25/2017  . Greater trochanteric bursitis, right 10/27/2017  . Lumbar radiculopathy 01/27/2017  . 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 Left shoulder pain 09/09/2016  . Trochanteric bursitis of left hip 09/09/2016  . Lateral epicondylitis of right elbow 11/19/2015  . 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 Right elbow pain 11/08/2015  . HYPERCHOLESTEROLEMIA 07/11/2010  . OBESITY 07/11/2010  . HYPERTENSION, BENIGN ESSENTIAL 07/11/2010  . DEGENERATIVE DISC DISEASE, LUMBAR SPINE  07/11/2010    Past Surgical History:  Procedure Laterality Date  . CESAREAN SECTION     x2  . fibroid ablation    . TUBAL LIGATION       OB History   No obstetric history on file.      Home Medications    Prior to Admission medications   Medication Sig Start Date End Date Taking? Authorizing Provider  aspirin EC 81 MG tablet Take 81 mg by mouth daily.   Yes [provider]  Ferrous Sulfate (IRON) 325 (65 FE) MG TABS Take 325 mg by mouth 2 (two) times daily.    Yes [provider]  naproxen (NAPROSYN) 375 MG tablet Take 1 tablet (375 mg total) by mouth 2 (two) times daily. Patient taking differently: Take 375 mg by mouth daily.  06/02/18  Yes Varney Biles, MD  pseudoephedrine-acetaminophen (TYLENOL SINUS) 30-500 MG TABS tablet Take 2 tablets by mouth daily as needed.   Yes [provider]  acetaminophen (TYLENOL 8 HOUR) 650 MG CR tablet Take 1 tablet (650 mg total) by mouth every 8 (eight) hours as needed. Patient not taking: Reported on 12/29/2018 06/02/18   Varney Biles, MD  gabapentin (NEURONTIN) 100 MG capsule Take 2 capsules (200 mg total) by mouth at bedtime. Patient not taking: Reported on 12/29/2018 07/16/18   Lyndal Pulley, DO  omeprazole (PRILOSEC) 20 MG capsule Take 1 capsule (20 mg total) by mouth  daily for 7 days. 12/29/18 01/05/19  Janeece Fitting, PA-C  predniSONE (DELTASONE) 50 MG tablet Take 1 tablet (50 mg total) by mouth daily. Patient not taking: Reported on 12/29/2018 07/26/18   Lyndal Pulley, DO    Family History Family History  Problem Relation Age of Onset  . Diabetes Mother   . Lung cancer Mother   . Pancreatic cancer Sister   . Other Brother        aids  . Colon cancer Neg Hx   . Colon polyps Neg Hx     Social History Social History   Tobacco Use  . Smoking status: Never Smoker  . Smokeless tobacco: Never Used  Substance Use Topics  . Alcohol use: No    Alcohol/week: 0.0 standard drinks  . Drug use: No      Allergies   Patient has no known allergies.   Review of Systems Review of Systems  Constitutional: Negative for chills and fever.  HENT: Negative for ear pain and sore throat.   Eyes: Negative for pain and visual disturbance.  Respiratory: Positive for cough and shortness of breath.   Cardiovascular: Positive for chest pain. Negative for palpitations.  Gastrointestinal: Positive for abdominal pain (epigastric). Negative for vomiting.  Genitourinary: Negative for dysuria and hematuria.  Musculoskeletal: Negative for arthralgias and back pain.  Skin: Negative for color change and rash.  Neurological: Negative for seizures and syncope.  All other systems reviewed and are negative.    Physical Exam Updated Vital Signs BP (!) 162/92   Pulse 71   Temp 99.1 F (37.3 C) (Oral)   Resp 19   SpO2 99%   Physical Exam Vitals signs and nursing note reviewed.  Constitutional:      General: She is not in acute distress.    Appearance: She is well-developed.  HENT:     Head: Normocephalic and atraumatic.     Mouth/Throat:     Pharynx: No oropharyngeal exudate.  Eyes:     Pupils: Pupils are equal, round, and reactive to light.  Neck:     Musculoskeletal: Normal range of motion.  Cardiovascular:     Rate and Rhythm: Regular rhythm.     Heart sounds: Normal heart sounds.  Pulmonary:     Effort: Pulmonary effort is normal. No respiratory distress.     Breath sounds: Examination of the right-lower field reveals decreased breath sounds. Examination of the left-lower field reveals decreased breath sounds. Decreased breath sounds present. No wheezing, rhonchi or rales.  Abdominal:     General: Bowel sounds are normal. There is no distension.     Palpations: Abdomen is soft.     Tenderness: There is no abdominal tenderness.  Musculoskeletal:        General: No tenderness or deformity.     Right lower leg: No edema.     Left lower leg: No edema.  Skin:    General: Skin is warm and dry.   Neurological:     Mental Status: She is alert and oriented to person, place, and time.      ED Treatments / Results  Labs (all labs ordered are listed, but only abnormal results are displayed) Labs Reviewed  BASIC METABOLIC PANEL - Abnormal; Notable for the following components:      Result Value   Potassium 3.4 (*)    Calcium 8.8 (*)    All other components within normal limits  CBC - Abnormal; Notable for the following components:   Hemoglobin 11.9 (*)  All other components within normal limits  TROPONIN I  HEPATIC FUNCTION PANEL  I-STAT BETA HCG BLOOD, ED ([facility], [facility], [facility] ONLY)    EKG None  Radiology Dg Chest 2 View  Result Date: 12/29/2018 CLINICAL DATA:  58 year old female with a history of chest pain and cough EXAM: CHEST - 2 VIEW COMPARISON:  08/13/2012 FINDINGS: Cardiomediastinal silhouette unchanged in size and contour. No pneumothorax. No pleural effusion. No evidence of interlobular septal thickening. No confluent airspace disease. No displaced fracture. IMPRESSION: Negative for acute cardiopulmonary disease Electronically Signed   By: Corrie Mckusick D.O.   On: 12/29/2018 14:51    Procedures Procedures (including critical care time)  Medications Ordered in ED Medications  alum & mag hydroxide-simeth (MAALOX/MYLANTA) 200-200-20 MG/5ML suspension 30 mL (30 mLs Oral Given 12/29/18 1639)    And  lidocaine (XYLOCAINE) 2 % viscous mouth solution 15 mL (15 mLs Oral Given 12/29/18 1639)     Initial Impression / Assessment and Plan / ED Course  I have reviewed the triage vital signs and the nursing notes.  Pertinent labs & imaging results that were available during my care of the patient were reviewed by me and considered in my medical decision making (see chart for details).    Patient with a previous history of hyperlipidemia, hypertension presents to the ED with complaints of chest pain x1 week.  She reports a burning sensation along the epigastric region radiating to  her chest.  Reports prior to this chest pain beginning she was having some epigastric abdominal pain which seem to be exacerbated by eating.  Vital signs are stable hypertensive on arrival.  Differential diagnoses include ACS, reflux, cardiopulmonary.  CBC showed no leukocytosis, hemoglobin slightly decreased.  BMP showed no electrolyte abnormality, creatinine level is normal.  First troponin was negative.  Will add on hepatic function.  Chest x-ray showed no consolidation, pneumothorax, pleural effusion.  4:22 PM Patient reports feeling about the same has not received Maalox at this time. Will reassess patient.   5:25 PM she reports improvement in symptoms, states GI cocktail helped soothe with the symptoms along with her throat burning sensation.  Suspect patient is likely suffering from reflux more so as she associated with previous abdominal pain.  Otherwise patient's vital signs are stable, patient is overall well-appearing.  At this time will discharge with follow-up to PCP. Will try her on omeprazole to help with her symptoms. Strict return precautions provided.    Final Clinical Impressions(s) / ED Diagnoses   Final diagnoses:  Atypical chest pain    ED Discharge Orders         Ordered    omeprazole (PRILOSEC) 20 MG capsule  Daily     12/29/18 1727           Janeece Fitting, PA-C 12/29/18 1728    Davonna Belling, MD 12/30/18 1100

## 2018-12-29 NOTE — Telephone Encounter (Signed)
Patient is calling about body aches and headache for 3 weeks.Patient seemed to get better-but then patient states she has now had congestion for 1 week. Patient has developed chest congestion with pressure and heaviness. Patient is coughing as well. No fever reported. Patient has labored breathing with chest heaviness, burning in throat and chest- patient advised that video visit may result in ED visit to have respiratory symptoms checked- decision made to  Go to ED to have patient checked there. Reason for Disposition . [1] MILD difficulty breathing (e.g., minimal/no SOB at rest, SOB with walking, pulse <100) AND [2] NEW-onset or WORSE than normal  Answer Assessment - Initial Assessment Questions 1. RESPIRATORY STATUS: "Describe your breathing?" (e.g., wheezing, shortness of breath, unable to speak, severe coughing)      Patient feels that she is labored with her breathing- it is different and feels there is a heaviness in the chest 2. ONSET: "When did this breathing problem begin?"      2 days ago 3. PATTERN "Does the difficult breathing come and go, or has it been constant since it started?"      Comes and goes- feels heaviness stays all the times 4. SEVERITY: "How bad is your breathing?" (e.g., mild, moderate, severe)    - MILD: No SOB at rest, mild SOB with walking, speaks normally in sentences, can lay down, no retractions, pulse < 100.    - MODERATE: SOB at rest, SOB with minimal exertion and prefers to sit, cannot lie down flat, speaks in phrases, mild retractions, audible wheezing, pulse 100-120.    - SEVERE: Very SOB at rest, speaks in single words, struggling to breathe, sitting hunched forward, retractions, pulse > 120      Episodic- not constant- but feels labored 5. RECURRENT SYMPTOM: "Have you had difficulty breathing before?" If so, ask: "When was the last time?" and "What happened that time?"      no 6. CARDIAC HISTORY: "Do you have any history of heart disease?" (e.g., heart  attack, angina, bypass surgery, angioplasty)      no 7. LUNG HISTORY: "Do you have any history of lung disease?"  (e.g., pulmonary embolus, asthma, emphysema)     no 8. CAUSE: "What do you think is causing the breathing problem?"      Allergy- but it has lingered and the allergy medication has not helped 9. OTHER SYMPTOMS: "Do you have any other symptoms? (e.g., dizziness, runny nose, cough, chest pain, fever)     Cough, chest congestion 10. PREGNANCY: "Is there any chance you are pregnant?" "When was your last menstrual period?"       n/a 11. TRAVEL: "Have you traveled out of the country in the last month?" (e.g., travel history, exposures)       No travel,no known exposure to virus  Protocols used: BREATHING DIFFICULTY-A-AH

## 2018-12-29 NOTE — ED Triage Notes (Signed)
Pt c/o URI/cold symptoms for 3 weeks. Reports that cough is dry, sore throat, right ear pain, and chest pains with SOB for week.

## 2019-10-14 LAB — HM DIABETES EYE EXAM

## 2020-06-26 ENCOUNTER — Telehealth: Payer: Self-pay | Admitting: Internal Medicine

## 2020-06-26 NOTE — Telephone Encounter (Signed)
Called patient to ask reason for transfer and per patient this location is closer to home, please advise. CB [phone]

## 2020-06-26 NOTE — Telephone Encounter (Signed)
What is the reason for transfer request?

## 2020-06-26 NOTE — Telephone Encounter (Signed)
Patient is calling and calling and requesting a TOC from Dr. Sharlet Salina to [facility], please advise. CB is [phone].

## 2020-06-27 NOTE — Telephone Encounter (Signed)
ok 

## 2020-08-08 NOTE — Telephone Encounter (Signed)
Fine with me

## 2020-08-27 ENCOUNTER — Encounter: Payer: Self-pay | Admitting: Nurse Practitioner

## 2020-08-27 ENCOUNTER — Other Ambulatory Visit (HOSPITAL_COMMUNITY)
Admission: RE | Admit: 2020-08-27 | Discharge: 2020-08-27 | Disposition: A | Discharge status: Home / Self Care | Payer: 59 | Source: Ambulatory Visit | Attending: Nurse Practitioner | Admitting: Nurse Practitioner

## 2020-08-27 ENCOUNTER — Other Ambulatory Visit: Payer: Self-pay

## 2020-08-27 ENCOUNTER — Ambulatory Visit (INDEPENDENT_AMBULATORY_CARE_PROVIDER_SITE_OTHER): Payer: 59 | Admitting: Nurse Practitioner

## 2020-08-27 VITALS — BP 144/88 | HR 78 | Temp 97.0°F | Ht 59.75 in | Wt 201.3 lb

## 2020-08-27 DIAGNOSIS — N760 Acute vaginitis: Secondary | ICD-10-CM

## 2020-08-27 DIAGNOSIS — Z0000 Encounter for general adult medical examination without abnormal findings: Secondary | ICD-10-CM

## 2020-08-27 DIAGNOSIS — Z124 Encounter for screening for malignant neoplasm of cervix: Secondary | ICD-10-CM | POA: Diagnosis not present

## 2020-08-27 DIAGNOSIS — I10 Essential (primary) hypertension: Secondary | ICD-10-CM | POA: Diagnosis not present

## 2020-08-27 DIAGNOSIS — Z0001 Encounter for general adult medical examination with abnormal findings: Secondary | ICD-10-CM

## 2020-08-27 DIAGNOSIS — E782 Mixed hyperlipidemia: Secondary | ICD-10-CM

## 2020-08-27 DIAGNOSIS — Z1231 Encounter for screening mammogram for malignant neoplasm of breast: Secondary | ICD-10-CM

## 2020-08-27 LAB — CBC WITH DIFFERENTIAL/PLATELET
Basophils Absolute: 0.1 10*3/uL (ref 0.0–0.1)
Basophils Relative: 1 % (ref 0.0–3.0)
Eosinophils Absolute: 0.1 10*3/uL (ref 0.0–0.7)
Eosinophils Relative: 1.5 % (ref 0.0–5.0)
HCT: 38.2 % (ref 36.0–46.0)
Hemoglobin: 12.3 g/dL (ref 12.0–15.0)
Lymphocytes Relative: 44.3 % (ref 12.0–46.0)
Lymphs Abs: 2.7 10*3/uL (ref 0.7–4.0)
MCHC: 32.2 g/dL (ref 30.0–36.0)
MCV: 84.3 fl (ref 78.0–100.0)
Monocytes Absolute: 0.6 10*3/uL (ref 0.1–1.0)
Monocytes Relative: 9 % (ref 3.0–12.0)
Neutro Abs: 2.7 10*3/uL (ref 1.4–7.7)
Neutrophils Relative %: 44.2 % (ref 43.0–77.0)
Platelets: 271 10*3/uL (ref 150.0–400.0)
RBC: 4.54 Mil/uL (ref 3.87–5.11)
RDW: 16.6 % — ABNORMAL HIGH (ref 11.5–15.5)
WBC: 6.2 10*3/uL (ref 4.0–10.5)

## 2020-08-27 LAB — LIPID PANEL
Cholesterol: 248 mg/dL — ABNORMAL HIGH (ref 0–200)
HDL: 59.9 mg/dL (ref >39.00)
LDL Cholesterol: 162 mg/dL — ABNORMAL HIGH (ref 0–99)
NonHDL: 187.89
Total CHOL/HDL Ratio: 4
Triglycerides: 131 mg/dL (ref 0.0–149.0)
VLDL: 26.2 mg/dL (ref 0.0–40.0)

## 2020-08-27 LAB — COMPREHENSIVE METABOLIC PANEL
ALT: 15 U/L (ref 0–35)
AST: 21 U/L (ref 0–37)
Albumin: 4.4 g/dL (ref 3.5–5.2)
Alkaline Phosphatase: 75 U/L (ref 39–117)
BUN: 10 mg/dL (ref 6–23)
CO2: 32 mEq/L (ref 19–32)
Calcium: 9.4 mg/dL (ref 8.4–10.5)
Chloride: 102 mEq/L (ref 96–112)
Creatinine, Ser: 0.9 mg/dL (ref 0.40–1.20)
GFR: 70.46 mL/min (ref >60.00)
Glucose, Bld: 82 mg/dL (ref 70–99)
Potassium: 3.7 mEq/L (ref 3.5–5.1)
Sodium: 141 mEq/L (ref 135–145)
Total Bilirubin: 0.4 mg/dL (ref 0.2–1.2)
Total Protein: 8 g/dL (ref 6.0–8.3)

## 2020-08-27 MED ORDER — FLUCONAZOLE 150 MG PO TABS: 150.0000 mg | ORAL_TABLET | Freq: Every day | ORAL | 0 refills | Status: DC

## 2020-08-27 MED ORDER — AMLODIPINE BESYLATE 5 MG PO TABS: 5.0000 mg | ORAL_TABLET | Freq: Every day | ORAL | 5 refills | Status: DC

## 2020-08-27 NOTE — Patient Instructions (Addendum)
Start amlodipine at bedtime Maintain DASH diet Start daily exercise: 30-78mins a day. Go to lab for blood draw.  Managing Your Hypertension Hypertension is commonly called high blood pressure. This is when the force of your blood pressing against the walls of your arteries is too strong. Arteries are blood vessels that carry blood from your heart throughout your body. Hypertension forces the heart to work harder to pump blood, and may cause the arteries to become narrow or stiff. Having untreated or uncontrolled hypertension can cause heart attack, stroke, kidney disease, and other problems. What are blood pressure readings? A blood pressure reading consists of a higher number over a lower number. Ideally, your blood pressure should be below 120/80. The first ("top") number is called the systolic pressure. It is a measure of the pressure in your arteries as your heart beats. The second ("bottom") number is called the diastolic pressure. It is a measure of the pressure in your arteries as the heart relaxes. What does my blood pressure reading mean? Blood pressure is classified into four stages. Based on your blood pressure reading, your health care provider may use the following stages to determine what type of treatment you need, if any. Systolic pressure and diastolic pressure are measured in a unit called mm Hg. Normal  Systolic pressure: below 016.  Diastolic pressure: below 80. Elevated  Systolic pressure: 553-748.  Diastolic pressure: below 80. Hypertension stage 1  Systolic pressure: 270-786.  Diastolic pressure: 75-44. Hypertension stage 2  Systolic pressure: 920 or above.  Diastolic pressure: 90 or above. What health risks are associated with hypertension? Managing your hypertension is an important responsibility. Uncontrolled hypertension can lead to:  A heart attack.  A stroke.  A weakened blood vessel (aneurysm).  Heart failure.  Kidney damage.  Eye  damage.  Metabolic syndrome.  Memory and concentration problems. What changes can I make to manage my hypertension? Hypertension can be managed by making lifestyle changes and possibly by taking medicines. Your health care provider will help you make a plan to bring your blood pressure within a normal range. Eating and drinking   Eat a diet that is high in fiber and potassium, and low in salt (sodium), added sugar, and fat. An example eating plan is called the DASH (Dietary Approaches to Stop Hypertension) diet. To eat this way: ? Eat plenty of fresh fruits and vegetables. Try to fill half of your plate at each meal with fruits and vegetables. ? Eat whole grains, such as whole wheat pasta, brown rice, or whole grain bread. Fill about one quarter of your plate with whole grains. ? Eat low-fat diary products. ? Avoid fatty cuts of meat, processed or cured meats, and poultry with skin. Fill about one quarter of your plate with lean proteins such as fish, chicken without skin, beans, eggs, and tofu. ? Avoid premade and processed foods. These tend to be higher in sodium, added sugar, and fat.  Reduce your daily sodium intake. Most people with hypertension should eat less than 1,500 mg of sodium a day.  Limit alcohol intake to no more than 1 drink a day for nonpregnant women and 2 drinks a day for men. One drink equals 12 oz of beer, 5 oz of wine, or 1 oz of hard liquor. Lifestyle  Work with your health care provider to maintain a healthy body weight, or to lose weight. Ask what an ideal weight is for you.  Get at least 30 minutes of exercise that causes your heart  to beat faster (aerobic exercise) most days of the week. Activities may include walking, swimming, or biking.  Include exercise to strengthen your muscles (resistance exercise), such as weight lifting, as part of your weekly exercise routine. Try to do these types of exercises for 30 minutes at least 3 days a week.  Do not use any  products that contain nicotine or tobacco, such as cigarettes and e-cigarettes. If you need help quitting, ask your health care provider.  Control any long-term (chronic) conditions you have, such as high cholesterol or diabetes. Monitoring  Monitor your blood pressure at home as told by your health care provider. Your personal target blood pressure may vary depending on your medical conditions, your age, and other factors.  Have your blood pressure checked regularly, as often as told by your health care provider. Working with your health care provider  Review all the medicines you take with your health care provider because there may be side effects or interactions.  Talk with your health care provider about your diet, exercise habits, and other lifestyle factors that may be contributing to hypertension.  Visit your health care provider regularly. Your health care provider can help you create and adjust your plan for managing hypertension. Will I need medicine to control my blood pressure? Your health care provider may prescribe medicine if lifestyle changes are not enough to get your blood pressure under control, and if:  Your systolic blood pressure is 130 or higher.  Your diastolic blood pressure is 80 or higher. Take medicines only as told by your health care provider. Follow the directions carefully. Blood pressure medicines must be taken as prescribed. The medicine does not work as well when you skip doses. Skipping doses also puts you at risk for problems. Contact a health care provider if:  You think you are having a reaction to medicines you have taken.  You have repeated (recurrent) headaches.  You feel dizzy.  You have swelling in your ankles.  You have trouble with your vision. Get help right away if:  You develop a severe headache or confusion.  You have unusual weakness or numbness, or you feel faint.  You have severe pain in your chest or abdomen.  You vomit  repeatedly.  You have trouble breathing. Summary  Hypertension is when the force of blood pumping through your arteries is too strong. If this condition is not controlled, it may put you at risk for serious complications.  Your personal target blood pressure may vary depending on your medical conditions, your age, and other factors. For most people, a normal blood pressure is less than 120/80.  Hypertension is managed by lifestyle changes, medicines, or both. Lifestyle changes include weight loss, eating a healthy, low-sodium diet, exercising more, and limiting alcohol. This information is not intended to replace advice given to you by your health care provider. Make sure you discuss any questions you have with your health care provider. Document Revised: 01/07/2019 Document Reviewed: 08/13/2016 Elsevier Patient Education  [place].

## 2020-08-27 NOTE — Progress Notes (Addendum)
Subjective:    Patient ID: Jill Shah, female    DOB: [birth year], 59 y.o.   MRN: [id number]  Patient presents today for CPE and eval of HTN  HPI HYPERTENSION, BENIGN ESSENTIAL Chronic, uncontrolled, asymptomatic BP Readings from Last 3 Encounters:  08/27/20 (!) 144/88  12/29/18 (!) 173/91  07/28/18 (!) 200/94   Check BMP and CBC. Start amlodipine F/up in 73months  HYPERCHOLESTEROLEMIA ASCVD risk of 9.8% Recommend DASH diet and regular exercise to help improve these numbers and promote weight loss. Need to repeat lipid panel in 73months (fasting) Lipid Panel     Component Value Date/Time   CHOL 248 (H) 08/27/2020 1332   TRIG 131.0 08/27/2020 1332   HDL 59.90 08/27/2020 1332   CHOLHDL 4 08/27/2020 1332   VLDL 26.2 08/27/2020 1332   LDLCALC 162 (H) 08/27/2020 1332     Sexual History (orientation,birth control, marital status, STD):needs PAP and mammogram.  Depression/Suicide: Depression screen [facility] 2/9 08/27/2020 06/03/2018  Decreased Interest 0 0  Down, Depressed, Hopeless 0 0  PHQ - 2 Score 0 0   Vision:up to date  Dental:up to date  Immunizations: (TDAP, Hep C screen, Pneumovax, Influenza, zoster)  Health Maintenance  Topic Date Due  . Mammogram  08/13/2017  . Flu Shot  12/27/2020*  . Tetanus Vaccine  08/27/2021*  .  Hepatitis C: One time screening is recommended by Center for Disease Control  (CDC) for  adults born from [birth year] through 1964.   08/27/2021*  . HIV Screening  08/27/2021*  . Pap Smear  08/28/2023  . Colon Cancer Screening  05/23/2025  . COVID-19 Vaccine  Completed  *Topic was postponed. The date shown is not the original due date.   Diet:regular.  Weight:  Wt Readings from Last 3 Encounters:  08/27/20 201 lb 4.8 oz (91.3 kg)  07/26/18 176 lb (79.8 kg)  07/16/18 189 lb (85.7 kg)   Fall Risk: Fall Risk  08/27/2020 06/03/2018  Falls in the past year? 0 No  Number falls in past yr: 0 -  Injury with Fall? 0 -   Medications and  allergies reviewed with patient and updated if appropriate.  Patient Active Problem List   Diagnosis Date Noted  . Left lumbar radiculopathy 07/16/2018  . Greater trochanteric bursitis, right 10/27/2017  . Trochanteric bursitis of left hip 09/09/2016  . 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 HYPERCHOLESTEROLEMIA 07/11/2010  . OBESITY 07/11/2010  . HYPERTENSION, BENIGN ESSENTIAL 07/11/2010  . 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   No current facility-administered medications on file prior to visit.    Past Medical History:  Diagnosis Date  . Frozen shoulder 09/25/2016   Injected in 09/25/2016  . Hypercholesterolemia   . Hypertension   . Lateral epicondylitis of right elbow 11/19/2015  . Left knee pain 06/03/2018  . Left leg swelling 06/03/2018  . Left shoulder pain 09/09/2016  . Lumbar radiculopathy 01/27/2017  . Right elbow pain 11/08/2015  . UTI (urinary tract infection) 12/25/2017    Past Surgical History:  Procedure Laterality Date  . CESAREAN SECTION     x2  . fibroid ablation    . TUBAL LIGATION      Social History   Socioeconomic History  . Marital status: Married    Spouse name: Not on file  . Number of children: Not on file  . Years of education: Not on file  . Highest education level: Not on  file  Occupational History  . Not on file  Tobacco Use  . Smoking status: Never Smoker  . Smokeless tobacco: Never Used  Vaping Use  . Vaping Use: Never used  Substance and Sexual Activity  . Alcohol use: No    Alcohol/week: 0.0 standard drinks  . Drug use: No  . Sexual activity: Not on file  Other Topics Concern  . Not on file  Social History Narrative  . Not on file   Social Determinants of Health   Financial Resource Strain:   . Difficulty of Paying Living Expenses: Not on file  Food Insecurity:   . Worried About Charity fundraiser in the Last Year: Not on file    . Ran Out of Food in the Last Year: Not on file  Transportation Needs:   . Lack of Transportation (Medical): Not on file  . Lack of Transportation (Non-Medical): Not on file  Physical Activity:   . Days of Exercise per Week: Not on file  . Minutes of Exercise per Session: Not on file  Stress:   . Feeling of Stress : Not on file  Social Connections:   . Frequency of Communication with Friends and Family: Not on file  . Frequency of Social Gatherings with Friends and Family: Not on file  . Attends Religious Services: Not on file  . Active Member of Clubs or Organizations: Not on file  . Attends Archivist Meetings: Not on file  . Marital Status: Not on file    Family History  Problem Relation Age of Onset  . Diabetes Mother   . Lung cancer Mother   . Pancreatic cancer Sister   . Other Brother        aids  . Colon cancer Neg Hx   . Colon polyps Neg Hx         Review of Systems  Constitutional: Negative for fever, malaise/fatigue and weight loss.  HENT: Negative for congestion and sore throat.   Eyes:       Negative for visual changes  Respiratory: Negative for cough and shortness of breath.   Cardiovascular: Negative for chest pain, palpitations and leg swelling.  Gastrointestinal: Negative for blood in stool, constipation, diarrhea and heartburn.  Genitourinary: Negative for dysuria, frequency and urgency.  Musculoskeletal: Negative for falls, joint pain and myalgias.  Skin: Negative for rash.  Neurological: Negative for dizziness, sensory change and headaches.  Endo/Heme/Allergies: Does not bruise/bleed easily.  Psychiatric/Behavioral: Negative for depression, substance abuse and suicidal ideas. The patient is not nervous/anxious.     Objective:   Vitals:   08/27/20 1304  BP: (!) 144/88  Pulse: 78  Temp: (!) 97 F (36.1 C)  SpO2: 98%    Body mass index is 39.64 kg/m.   Physical Examination:  Physical Exam Vitals reviewed. Exam conducted with  a chaperone present.  Constitutional:      Appearance: She is obese.  Eyes:     Extraocular Movements: Extraocular movements intact.     Conjunctiva/sclera: Conjunctivae normal.  Cardiovascular:     Rate and Rhythm: Normal rate and regular rhythm.     Pulses: Normal pulses.     Heart sounds: Normal heart sounds.  Pulmonary:     Effort: Pulmonary effort is normal.     Breath sounds: Normal breath sounds.  Chest:     Breasts:        Right: Normal.        Left: Normal.  Abdominal:  General: Bowel sounds are normal.     Palpations: Abdomen is soft.     Hernia: There is no hernia in the left inguinal area or right inguinal area.  Genitourinary:    Labia:        Right: No rash or tenderness.        Left: No tenderness.      Vagina: Vaginal discharge present. No erythema, tenderness or bleeding.     Cervix: Normal.     Adnexa: Right adnexa normal and left adnexa normal.  Musculoskeletal:        General: Normal range of motion.     Cervical back: Normal range of motion and neck supple.     Right lower leg: No edema.     Left lower leg: No edema.  Lymphadenopathy:     Upper Body:     Right upper body: No supraclavicular, axillary or pectoral adenopathy.     Left upper body: No supraclavicular, axillary or pectoral adenopathy.     Lower Body: No right inguinal adenopathy.  Skin:    General: Skin is warm and dry.  Neurological:     Mental Status: She is alert and oriented to person, place, and time.  Psychiatric:        Mood and Affect: Mood normal.        Behavior: Behavior normal.        Thought Content: Thought content normal.     ASSESSMENT and PLAN: This visit occurred during the SARS-CoV-2 public health emergency.  Safety protocols were in place, including screening questions prior to the visit, additional usage of staff PPE, and extensive cleaning of exam room while observing appropriate contact time as indicated for disinfecting solutions.   Jill Shah was seen today  for establish care.  Diagnoses and all orders for this visit:  Encounter for preventative adult health care exam with abnormal findings -     Cytology - PAP( [place]) -     CBC with Differential/Platelet  Primary hypertension -     amLODipine (NORVASC) 5 MG tablet; Take 1 tablet (5 mg total) by mouth at bedtime. -     Comprehensive metabolic panel  Mixed hyperlipidemia -     Comprehensive metabolic panel -     Lipid panel  Encounter for Papanicolaou smear for cervical cancer screening -     Cytology - PAP( [place])  Breast cancer screening by mammogram -     MM 3D SCREEN BREAST BILATERAL; Future  Acute vaginitis -     fluconazole (DIFLUCAN) 150 MG tablet; Take 1 tablet (150 mg total) by mouth daily. Take second tab 3days apart from first tab        Problem List Items Addressed This Visit      Cardiovascular and Mediastinum   HYPERTENSION, BENIGN ESSENTIAL    Chronic, uncontrolled, asymptomatic BP Readings from Last 3 Encounters:  08/27/20 (!) 144/88  12/29/18 (!) 173/91  07/28/18 (!) 200/94   Check BMP and CBC. Start amlodipine F/up in 71months      Relevant Medications   amLODipine (NORVASC) 5 MG tablet     Other   HYPERCHOLESTEROLEMIA    ASCVD risk of 9.8% Recommend DASH diet and regular exercise to help improve these numbers and promote weight loss. Need to repeat lipid panel in 7months (fasting) Lipid Panel     Component Value Date/Time   CHOL 248 (H) 08/27/2020 1332   TRIG 131.0 08/27/2020 1332   HDL 59.90 08/27/2020 1332  CHOLHDL 4 08/27/2020 1332   VLDL 26.2 08/27/2020 1332   LDLCALC 162 (H) 08/27/2020 1332         Relevant Medications   amLODipine (NORVASC) 5 MG tablet    Other Visit Diagnoses    Encounter for preventative adult health care exam with abnormal findings    -  Primary   Relevant Orders   Cytology - PAP( [place]) (Completed)   CBC with Differential/Platelet (Completed)   Encounter for Papanicolaou smear for  cervical cancer screening       Relevant Orders   Cytology - PAP( [place]) (Completed)   Breast cancer screening by mammogram       Relevant Orders   MM 3D SCREEN BREAST BILATERAL   Acute vaginitis       Relevant Medications   fluconazole (DIFLUCAN) 150 MG tablet      Follow up: Return in about 3 months (around 11/26/2020) for HTN.  Wilfred Lacy, NP

## 2020-08-27 NOTE — Assessment & Plan Note (Signed)
Chronic, uncontrolled, asymptomatic BP Readings from Last 3 Encounters:  08/27/20 (!) 144/88  12/29/18 (!) 173/91  07/28/18 (!) 200/94   Check BMP and CBC. Start amlodipine F/up in 91months

## 2020-08-29 LAB — CYTOLOGY - PAP
Adequacy: ABSENT
Comment: NEGATIVE
Diagnosis: NEGATIVE
High risk HPV: NEGATIVE

## 2020-08-29 NOTE — Assessment & Plan Note (Addendum)
ASCVD risk of 9.8% Recommend DASH diet and regular exercise to help improve these numbers and promote weight loss. Need to repeat lipid panel in 89months (fasting) Lipid Panel     Component Value Date/Time   CHOL 248 (H) 08/27/2020 1332   TRIG 131.0 08/27/2020 1332   HDL 59.90 08/27/2020 1332   CHOLHDL 4 08/27/2020 1332   VLDL 26.2 08/27/2020 1332   LDLCALC 162 (H) 08/27/2020 1332

## 2020-11-26 ENCOUNTER — Encounter: Payer: Self-pay | Admitting: Nurse Practitioner

## 2020-11-26 ENCOUNTER — Ambulatory Visit (INDEPENDENT_AMBULATORY_CARE_PROVIDER_SITE_OTHER): Payer: 59 | Admitting: Nurse Practitioner

## 2020-11-26 ENCOUNTER — Other Ambulatory Visit: Payer: Self-pay

## 2020-11-26 VITALS — BP 142/88 | HR 88 | Temp 97.8°F | Ht 60.0 in | Wt 202.8 lb

## 2020-11-26 DIAGNOSIS — I10 Essential (primary) hypertension: Secondary | ICD-10-CM | POA: Diagnosis not present

## 2020-11-26 MED ORDER — AMLODIPINE BESYLATE 5 MG PO TABS: 5.0000 mg | ORAL_TABLET | Freq: Every day | ORAL | 3 refills | Status: DC

## 2020-11-26 MED ORDER — HYDROCHLOROTHIAZIDE 12.5 MG PO CAPS: 12.5000 mg | ORAL_CAPSULE | Freq: Every morning | ORAL | 1 refills | Status: DC

## 2020-11-26 NOTE — Patient Instructions (Signed)
Maintain amlodipine at bedtime Start HCTZ in AM Monitor BP in AM and record 2x/week. F/up in 28month

## 2020-11-26 NOTE — Progress Notes (Signed)
   Subjective:  Patient ID: Jill Shah, female    DOB: 1961-09-14  Age: 60 y.o. MRN: [id number]  CC: Follow-up (3 month f/u on HTN. Declines flu vaccine. )  HPI  HYPERTENSION, BENIGN ESSENTIAL BP not at goal with amlodipine. BP Readings from Last 3 Encounters:  11/26/20 (!) 142/88  08/27/20 (!) 144/88  12/29/18 (!) 173/91   Add HCTZ 12.5mg  in AM and maintain amlodipine in PM F/up in 46month  Reviewed past Medical, Social and Family history today.  Outpatient Medications Prior to Visit  Medication Sig Dispense Refill  . amLODipine (NORVASC) 5 MG tablet Take 1 tablet (5 mg total) by mouth at bedtime. 30 tablet 5  . fluconazole (DIFLUCAN) 150 MG tablet Take 1 tablet (150 mg total) by mouth daily. Take second tab 3days apart from first tab (Patient not taking: Reported on 11/26/2020) 2 tablet 0   No facility-administered medications prior to visit.    ROS See HPI  Objective:  BP (!) 142/88 (BP Location: Left Arm, Patient Position: Sitting, Cuff Size: Large)   Pulse 88   Temp 97.8 F (36.6 C) (Temporal)   Ht 5' (1.524 m)   Wt 202 lb 12.8 oz (92 kg)   SpO2 98%   BMI 39.61 kg/m   Physical Exam Constitutional:      Appearance: She is obese.  Cardiovascular:     Rate and Rhythm: Normal rate and regular rhythm.     Pulses: Normal pulses.     Heart sounds: Normal heart sounds.  Pulmonary:     Effort: Pulmonary effort is normal.     Breath sounds: Normal breath sounds.  Musculoskeletal:     Right lower leg: Edema present.     Left lower leg: Edema present.  Neurological:     Mental Status: She is alert and oriented to person, place, and time.    Assessment & Plan:  This visit occurred during the SARS-CoV-2 public health emergency.  Safety protocols were in place, including screening questions prior to the visit, additional usage of staff PPE, and extensive cleaning of exam room while observing appropriate contact time as indicated for disinfecting solutions.    Ryana was seen today for follow-up.  Diagnoses and all orders for this visit:  HYPERTENSION, BENIGN ESSENTIAL -     amLODipine (NORVASC) 5 MG tablet; Take 1 tablet (5 mg total) by mouth at bedtime. -     hydrochlorothiazide (MICROZIDE) 12.5 MG capsule; Take 1 capsule (12.5 mg total) by mouth every morning.   Problem List Items Addressed This Visit      Cardiovascular and Mediastinum   HYPERTENSION, BENIGN ESSENTIAL - Primary    BP not at goal with amlodipine. BP Readings from Last 3 Encounters:  11/26/20 (!) 142/88  08/27/20 (!) 144/88  12/29/18 (!) 173/91   Add HCTZ 12.5mg  in AM and maintain amlodipine in PM F/up in 39month      Relevant Medications   amLODipine (NORVASC) 5 MG tablet   hydrochlorothiazide (MICROZIDE) 12.5 MG capsule      Follow-up: Return in about 4 weeks (around 12/24/2020) for HTN.  Wilfred Lacy, NP

## 2020-11-26 NOTE — Assessment & Plan Note (Signed)
BP not at goal with amlodipine. BP Readings from Last 3 Encounters:  11/26/20 (!) 142/88  08/27/20 (!) 144/88  12/29/18 (!) 173/91   Add HCTZ 12.5mg  in AM and maintain amlodipine in PM F/up in 36month

## 2020-12-24 ENCOUNTER — Ambulatory Visit: Payer: PRIVATE HEALTH INSURANCE | Admitting: Nurse Practitioner

## 2020-12-24 DIAGNOSIS — Z0289 Encounter for other administrative examinations: Secondary | ICD-10-CM

## 2021-01-07 ENCOUNTER — Ambulatory Visit: Payer: PRIVATE HEALTH INSURANCE | Admitting: Nurse Practitioner

## 2021-11-05 ENCOUNTER — Other Ambulatory Visit: Payer: Self-pay

## 2021-11-05 ENCOUNTER — Encounter: Payer: Self-pay | Admitting: Nurse Practitioner

## 2021-11-05 ENCOUNTER — Ambulatory Visit (INDEPENDENT_AMBULATORY_CARE_PROVIDER_SITE_OTHER): Payer: 59 | Admitting: Nurse Practitioner

## 2021-11-05 VITALS — BP 138/88 | HR 88 | Temp 97.1°F | Ht 59.75 in | Wt 201.0 lb

## 2021-11-05 DIAGNOSIS — R350 Frequency of micturition: Secondary | ICD-10-CM

## 2021-11-05 DIAGNOSIS — Z13220 Encounter for screening for lipoid disorders: Secondary | ICD-10-CM

## 2021-11-05 DIAGNOSIS — Z136 Encounter for screening for cardiovascular disorders: Secondary | ICD-10-CM | POA: Diagnosis not present

## 2021-11-05 DIAGNOSIS — I10 Essential (primary) hypertension: Secondary | ICD-10-CM | POA: Diagnosis not present

## 2021-11-05 DIAGNOSIS — Z1231 Encounter for screening mammogram for malignant neoplasm of breast: Secondary | ICD-10-CM | POA: Diagnosis not present

## 2021-11-05 DIAGNOSIS — Z0001 Encounter for general adult medical examination with abnormal findings: Secondary | ICD-10-CM | POA: Diagnosis not present

## 2021-11-05 NOTE — Progress Notes (Signed)
Subjective:    Patient ID: Jill Shah, female    DOB: Oct 25, 1960, 60 y.o.   MRN: [id number]  Patient presents today for CPE and eval of chronic conditions  HPI HYPERTENSION, BENIGN ESSENTIAL She discontinue amlodipine and HCTZ>70months ago. Does not check BP at home. BP today is at goal without med BP Readings from Last 3 Encounters:  11/05/21 138/88  11/26/20 (!) 142/88  08/27/20 (!) 144/88   Advised to Monitor BP at home 2-3x/week Call office if BP >140/80. Maintain DASH diet and regular exercise  Vision:will schedule Dental:up to date Diet:heart healthy diet Exercise:walking Weight:  Wt Readings from Last 3 Encounters:  11/05/21 201 lb (91.2 kg)  11/26/20 202 lb 12.8 oz (92 kg)  08/27/20 201 lb 4.8 oz (91.3 kg)    Sexual History (orientation,birth control, marital status, STD):sexually active, no need for STD screen, up to date with PAP, needs repeat mammogram and breast exam today Up to date with colonoscopy.  Depression/Suicide: Depression screen [facility] 2/9 11/05/2021 08/27/2020 06/03/2018  Decreased Interest 0 0 0  Down, Depressed, Hopeless 0 0 0  PHQ - 2 Score 0 0 0   Immunizations: (TDAP, Hep C screen, Pneumovax, Influenza, zoster)  Health Maintenance  Topic Date Due   Mammogram  08/13/2017   COVID-19 Vaccine (3 - Booster for Pfizer series) 03/22/2020   Flu Shot  12/27/2021*   Zoster (Shingles) Vaccine (1 of 2) 02/02/2022*   Tetanus Vaccine  11/05/2022*   Hepatitis C Screening: USPSTF Recommendation to screen - Ages 18-79 yo.  11/05/2022*   HIV Screening  11/05/2022*   Pap Smear  08/28/2023   Colon Cancer Screening  05/23/2025   HPV Vaccine  Aged Out  *Topic was postponed. The date shown is not the original due date.   Fall Risk: Fall Risk  11/05/2021 08/27/2020 06/03/2018  Falls in the past year? 0 0 No  Number falls in past yr: 0 0 -  Injury with Fall? 0 0 -  Risk for fall due to : No Fall Risks - -  Follow up Falls evaluation completed - -    Medications and allergies reviewed with patient and updated if appropriate.  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 Swallow whole.     amLODipine (NORVASC) 5 MG tablet Take 1 tablet (5 mg total) by mouth at bedtime. (Patient not taking: Reported on 11/05/2021) 90 tablet 3   No current facility-administered medications on file prior to visit.    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   Injected in 09/25/2016   Hypercholesterolemia    Hypertension    Lateral epicondylitis of right elbow 11/19/2015   Left knee pain 06/03/2018   Left leg swelling 06/03/2018   Left shoulder pain 09/09/2016   Lumbar radiculopathy 01/27/2017   Right elbow pain 11/08/2015   UTI (urinary tract infection) 12/25/2017    Past Surgical History:  Procedure Laterality Date   CESAREAN SECTION     x2   fibroid ablation  TUBAL LIGATION      Social History   Socioeconomic History   Marital status: Married    Spouse name: Not on file   Number of children: Not on file   Years of education: Not on file   Highest education level: Not on file  Occupational History   Not on file  Tobacco Use   Smoking status: Never   Smokeless tobacco: Never  Vaping Use   Vaping Use: Never used  Substance and Sexual Activity   Alcohol use: No    Alcohol/week: 0.0 standard drinks   Drug use: No   Sexual activity: Not on file  Other Topics Concern   Not on file  Social History Narrative   Not on  file   Social Determinants of Health   Financial Resource Strain: Not on file  Food Insecurity: Not on file  Transportation Needs: Not on file  Physical Activity: Not on file  Stress: Not on file  Social Connections: Not on file    Family History  Problem Relation Age of Onset   Diabetes Mother    Lung cancer Mother    Pancreatic cancer Sister    Other Brother        aids   Colon cancer Neg Hx    Colon polyps Neg Hx         Review of Systems  Constitutional:  Negative for fever, malaise/fatigue and weight loss.  HENT:  Negative for congestion and sore throat.   Eyes:        Negative for visual changes  Respiratory:  Negative for cough and shortness of breath.   Cardiovascular:  Negative for chest pain, palpitations and leg swelling.  Gastrointestinal:  Negative for blood in stool, constipation, diarrhea and heartburn.  Genitourinary:  Positive for frequency. Negative for dysuria and urgency.  Musculoskeletal:  Negative for falls, joint pain and myalgias.  Skin:  Negative for rash.  Neurological:  Negative for dizziness, sensory change and headaches.  Endo/Heme/Allergies:  Does not bruise/bleed easily.  Psychiatric/Behavioral:  Negative for depression, substance abuse and suicidal ideas. The patient is not nervous/anxious.    Objective:   Vitals:   11/05/21 1338 11/05/21 1347  BP: (!) 158/92 138/88  Pulse: 86 88  Temp: (!) 97.1 F (36.2 C)   SpO2: 99%     Body mass index is 39.58 kg/m.   Physical Examination:  Physical Exam Vitals reviewed.  Constitutional:      General: She is not in acute distress.    Appearance: She is obese.  HENT:     Right Ear: Tympanic membrane, ear canal and external ear normal.     Left Ear: Tympanic membrane, ear canal and external ear normal.  Eyes:     General: No scleral icterus.    Extraocular Movements: Extraocular movements intact.     Conjunctiva/sclera: Conjunctivae normal.  Cardiovascular:     Rate and Rhythm:  Normal rate and regular rhythm.     Pulses: Normal pulses.     Heart sounds: Normal heart sounds.  Pulmonary:     Effort: Pulmonary effort is normal. No respiratory distress.     Breath sounds: Normal breath sounds.  Chest:  Breasts:    Breasts are symmetrical.     Right: Normal.     Left: Normal.  Abdominal:     General: There is no distension.     Palpations: Abdomen is soft.  Musculoskeletal:        General: Normal range of motion.  Cervical back: Normal range of motion and neck supple.     Right lower leg: No edema.     Left lower leg: No edema.  Lymphadenopathy:     Cervical: No cervical adenopathy.     Upper Body:     Right upper body: No supraclavicular, axillary or pectoral adenopathy.     Left upper body: No supraclavicular, axillary or pectoral adenopathy.  Skin:    General: Skin is warm and dry.  Neurological:     Mental Status: She is alert and oriented to person, place, and time.  Psychiatric:        Mood and Affect: Mood normal.        Behavior: Behavior normal.        Thought Content: Thought content normal.   ASSESSMENT and PLAN: This visit occurred during the SARS-CoV-2 public health emergency.  Safety protocols were in place, including screening questions prior to the visit, additional usage of staff PPE, and extensive cleaning of exam room while observing appropriate contact time as indicated for disinfecting solutions.   Riata was seen today for annual exam.  Diagnoses and all orders for this visit:  Encounter for preventative adult health care exam with abnormal findings -     Comprehensive metabolic panel -     CBC with Differential/Platelet -     Lipid panel -     TSH  Urinary frequency -     Urinalysis w microscopic + reflex cultur -     Hemoglobin A1c  Breast cancer screening by mammogram -     MM 3D SCREEN BREAST BILATERAL; Future  HYPERTENSION, BENIGN ESSENTIAL  Encounter for lipid screening for cardiovascular disease -     Lipid  panel      Problem List Items Addressed This Visit       Cardiovascular and Mediastinum   HYPERTENSION, BENIGN ESSENTIAL    She discontinue amlodipine and HCTZ>74months ago. Does not check BP at home. BP today is at goal without med BP Readings from Last 3 Encounters:  11/05/21 138/88  11/26/20 (!) 142/88  08/27/20 (!) 144/88   Advised to Monitor BP at home 2-3x/week Call office if BP >140/80. Maintain DASH diet and regular exercise      Relevant Medications   aspirin EC 81 MG tablet   Other Visit Diagnoses     Encounter for preventative adult health care exam with abnormal findings    -  Primary   Relevant Orders   Comprehensive metabolic panel   CBC with Differential/Platelet   Lipid panel   TSH   Urinary frequency       Relevant Orders   Urinalysis w microscopic + reflex cultur   Hemoglobin A1c   Breast cancer screening by mammogram       Relevant Orders   MM 3D SCREEN BREAST BILATERAL   Encounter for lipid screening for cardiovascular disease       Relevant Orders   Lipid panel       Follow up: Return in about 3 months (around 02/02/2022) for HTN.  Wilfred Lacy, NP

## 2021-11-05 NOTE — Patient Instructions (Signed)
Go to lab for blood draw and urine collection. You will be contacted to schedule appt for mammogram. Schedule appt for dental cleaning and eye exam. Maintain DASH diet and regular exercise. Monitor BP at home 2-3x/week Call office if BP >140/80.  Preventive Care 58-60 Years Old, Female Preventive care refers to lifestyle choices and visits with your health care provider that can promote health and wellness. Preventive care visits are also called wellness exams. What can I expect for my preventive care visit? Counseling Your health care provider may ask you questions about your: Medical history, including: Past medical problems. Family medical history. Pregnancy history. Current health, including: Menstrual cycle. Method of birth control. Emotional well-being. Home life and relationship well-being. Sexual activity and sexual health. Lifestyle, including: Alcohol, nicotine or tobacco, and drug use. Access to firearms. Diet, exercise, and sleep habits. Work and work Statistician. Sunscreen use. Safety issues such as seatbelt and bike helmet use. Physical exam Your health care provider will check your: Height and weight. These may be used to calculate your BMI (body mass index). BMI is a measurement that tells if you are at a healthy weight. Waist circumference. This measures the distance around your waistline. This measurement also tells if you are at a healthy weight and may help predict your risk of certain diseases, such as type 2 diabetes and high blood pressure. Heart rate and blood pressure. Body temperature. Skin for abnormal spots. What immunizations do I need? Vaccines are usually given at various ages, according to a schedule. Your health care provider will recommend vaccines for you based on your age, medical history, and lifestyle or other factors, such as travel or where you work. What tests do I need? Screening Your health care provider may recommend screening tests  for certain conditions. This may include: Lipid and cholesterol levels. Diabetes screening. This is done by checking your blood sugar (glucose) after you have not eaten for a while (fasting). Pelvic exam and Pap test. Hepatitis B test. Hepatitis C test. HIV (human immunodeficiency virus) test. STI (sexually transmitted infection) testing, if you are at risk. Lung cancer screening. Colorectal cancer screening. Mammogram. Talk with your health care provider about when you should start having regular mammograms. This may depend on whether you have a family history of breast cancer. BRCA-related cancer screening. This may be done if you have a family history of breast, ovarian, tubal, or peritoneal cancers. Bone density scan. This is done to screen for osteoporosis. Talk with your health care provider about your test results, treatment options, and if necessary, the need for more tests. Follow these instructions at home: Eating and drinking  Eat a diet that includes fresh fruits and vegetables, whole grains, lean protein, and low-fat dairy products. Take vitamin and mineral supplements as recommended by your health care provider. Do not drink alcohol if: Your health care provider tells you not to drink. You are pregnant, may be pregnant, or are planning to become pregnant. If you drink alcohol: Limit how much you have to 0-1 drink a day. Know how much alcohol is in your drink. In the [place] one drink equals one 12 oz bottle of beer (355 mL), one 5 oz glass of wine (148 mL), or one 1 oz glass of hard liquor (44 mL). Lifestyle Brush your teeth every morning and night with fluoride toothpaste. Floss one time each day. Exercise for at least 30 minutes 5 or more days each week. Do not use any products that contain nicotine or tobacco. These  products include cigarettes, chewing tobacco, and vaping devices, such as e-cigarettes. If you need help quitting, ask your health care provider. Do not use  drugs. If you are sexually active, practice safe sex. Use a condom or other form of protection to prevent STIs. If you do not wish to become pregnant, use a form of birth control. If you plan to become pregnant, see your health care provider for a prepregnancy visit. Take aspirin only as told by your health care provider. Make sure that you understand how much to take and what form to take. Work with your health care provider to find out whether it is safe and beneficial for you to take aspirin daily. Find healthy ways to manage stress, such as: Meditation, yoga, or listening to music. Journaling. Talking to a trusted person. Spending time with friends and family. Minimize exposure to UV radiation to reduce your risk of skin cancer. Safety Always wear your seat belt while driving or riding in a vehicle. Do not drive: If you have been drinking alcohol. Do not ride with someone who has been drinking. When you are tired or distracted. While texting. If you have been using any mind-altering substances or drugs. Wear a helmet and other protective equipment during sports activities. If you have firearms in your house, make sure you follow all gun safety procedures. Seek help if you have been physically or sexually abused. What's next? Visit your health care provider once a year for an annual wellness visit. Ask your health care provider how often you should have your eyes and teeth checked. Stay up to date on all vaccines. This information is not intended to replace advice given to you by your health care provider. Make sure you discuss any questions you have with your health care provider. Document Revised: 03/13/2021 Document Reviewed: 03/13/2021 Elsevier Patient Education  [place].

## 2021-11-05 NOTE — Assessment & Plan Note (Signed)
She discontinue amlodipine and HCTZ>66months ago. Does not check BP at home. BP today is at goal without med BP Readings from Last 3 Encounters:  11/05/21 138/88  11/26/20 (!) 142/88  08/27/20 (!) 144/88   Advised to Monitor BP at home 2-3x/week Call office if BP >140/80. Maintain DASH diet and regular exercise

## 2021-11-06 LAB — COMPREHENSIVE METABOLIC PANEL
ALT: 13 U/L (ref 0–35)
AST: 17 U/L (ref 0–37)
Albumin: 4.3 g/dL (ref 3.5–5.2)
Alkaline Phosphatase: 77 U/L (ref 39–117)
BUN: 10 mg/dL (ref 6–23)
CO2: 27 mEq/L (ref 19–32)
Calcium: 9.2 mg/dL (ref 8.4–10.5)
Chloride: 100 mEq/L (ref 96–112)
Creatinine, Ser: 0.93 mg/dL (ref 0.40–1.20)
GFR: 67.17 mL/min (ref >60.00)
Glucose, Bld: 85 mg/dL (ref 70–99)
Potassium: 3.9 mEq/L (ref 3.5–5.1)
Sodium: 142 mEq/L (ref 135–145)
Total Bilirubin: 0.4 mg/dL (ref 0.2–1.2)
Total Protein: 7.9 g/dL (ref 6.0–8.3)

## 2021-11-06 LAB — LIPID PANEL
Cholesterol: 277 mg/dL — ABNORMAL HIGH (ref 0–200)
HDL: 60.3 mg/dL (ref >39.00)
LDL Cholesterol: 192 mg/dL — ABNORMAL HIGH (ref 0–99)
NonHDL: 216.67
Total CHOL/HDL Ratio: 5
Triglycerides: 125 mg/dL (ref 0.0–149.0)
VLDL: 25 mg/dL (ref 0.0–40.0)

## 2021-11-06 LAB — CBC WITH DIFFERENTIAL/PLATELET
Basophils Absolute: 0.1 10*3/uL (ref 0.0–0.1)
Basophils Relative: 0.9 % (ref 0.0–3.0)
Eosinophils Absolute: 0.1 10*3/uL (ref 0.0–0.7)
Eosinophils Relative: 0.9 % (ref 0.0–5.0)
HCT: 38.3 % (ref 36.0–46.0)
Hemoglobin: 12.2 g/dL (ref 12.0–15.0)
Lymphocytes Relative: 49.6 % — ABNORMAL HIGH (ref 12.0–46.0)
Lymphs Abs: 3.2 10*3/uL (ref 0.7–4.0)
MCHC: 31.8 g/dL (ref 30.0–36.0)
MCV: 85.1 fl (ref 78.0–100.0)
Monocytes Absolute: 0.4 10*3/uL (ref 0.1–1.0)
Monocytes Relative: 6.8 % (ref 3.0–12.0)
Neutro Abs: 2.7 10*3/uL (ref 1.4–7.7)
Neutrophils Relative %: 41.8 % — ABNORMAL LOW (ref 43.0–77.0)
Platelets: 299 10*3/uL (ref 150.0–400.0)
RBC: 4.49 Mil/uL (ref 3.87–5.11)
RDW: 17 % — ABNORMAL HIGH (ref 11.5–15.5)
WBC: 6.5 10*3/uL (ref 4.0–10.5)

## 2021-11-06 LAB — URINALYSIS W MICROSCOPIC + REFLEX CULTURE
Bilirubin Urine: NEGATIVE
Glucose, UA: NEGATIVE
Hgb urine dipstick: NEGATIVE
Hyaline Cast: NONE SEEN /LPF
Ketones, ur: NEGATIVE
Leukocyte Esterase: NEGATIVE
Nitrites, Initial: NEGATIVE
Protein, ur: NEGATIVE
Specific Gravity, Urine: 1.024 (ref 1.001–1.035)
WBC, UA: NONE SEEN /HPF (ref 0–5)
pH: 6.5 (ref 5.0–8.0)

## 2021-11-06 LAB — NO CULTURE INDICATED

## 2021-11-06 LAB — HEMOGLOBIN A1C: Hgb A1c MFr Bld: 6.6 % — ABNORMAL HIGH (ref 4.6–6.5)

## 2021-11-06 LAB — TSH: TSH: 0.92 u[IU]/mL (ref 0.35–5.50)

## 2021-11-09 ENCOUNTER — Encounter: Payer: Self-pay | Admitting: Nurse Practitioner

## 2021-11-09 DIAGNOSIS — E119 Type 2 diabetes mellitus without complications: Secondary | ICD-10-CM | POA: Insufficient documentation

## 2021-12-16 ENCOUNTER — Other Ambulatory Visit: Payer: Self-pay

## 2021-12-16 ENCOUNTER — Ambulatory Visit
Admission: RE | Admit: 2021-12-16 | Discharge: 2021-12-16 | Disposition: A | Discharge status: Home / Self Care | Payer: 59 | Source: Ambulatory Visit | Attending: Nurse Practitioner | Admitting: Nurse Practitioner

## 2021-12-16 DIAGNOSIS — Z1231 Encounter for screening mammogram for malignant neoplasm of breast: Secondary | ICD-10-CM

## 2021-12-17 NOTE — Progress Notes (Signed)
Normal, repeat in 1-68yr

## 2022-02-06 ENCOUNTER — Ambulatory Visit (INDEPENDENT_AMBULATORY_CARE_PROVIDER_SITE_OTHER): Payer: 59 | Admitting: Sports Medicine

## 2022-02-06 VITALS — BP 138/80 | HR 72 | Ht 59.0 in | Wt 191.0 lb

## 2022-02-06 DIAGNOSIS — G8929 Other chronic pain: Secondary | ICD-10-CM | POA: Diagnosis not present

## 2022-02-06 DIAGNOSIS — M5442 Lumbago with sciatica, left side: Secondary | ICD-10-CM | POA: Diagnosis not present

## 2022-02-06 DIAGNOSIS — M5137 Other intervertebral disc degeneration, lumbosacral region: Secondary | ICD-10-CM | POA: Diagnosis not present

## 2022-02-06 DIAGNOSIS — M5416 Radiculopathy, lumbar region: Secondary | ICD-10-CM

## 2022-02-06 MED ORDER — KETOROLAC TROMETHAMINE 60 MG/2ML IM SOLN
60.0000 mg | Freq: Once | INTRAMUSCULAR | Status: AC
  Administered 2022-02-06: 60 mg via INTRAMUSCULAR

## 2022-02-06 MED ORDER — METHYLPREDNISOLONE ACETATE 80 MG/ML IJ SUSP
80.0000 mg | Freq: Once | INTRAMUSCULAR | Status: AC
  Administered 2022-02-06: 80 mg via INTRAMUSCULAR

## 2022-02-06 MED ORDER — MELOXICAM 15 MG PO TABS: 15.0000 mg | ORAL_TABLET | Freq: Every day | ORAL | 0 refills | Status: DC

## 2022-02-06 NOTE — Patient Instructions (Addendum)
Good to see you  ?- Start meloxicam 15 mg daily x2 weeks.  If still having pain after 2 weeks, complete 3rd-week of meloxicam. May use remaining meloxicam as needed once daily for pain control.  Do not to use additional NSAIDs while taking meloxicam.  May use Tylenol [phone] mg 2 to 3 times a day for breakthrough pain. START TOMORROW  ?3 week follow up  ? ?

## 2022-02-06 NOTE — Progress Notes (Signed)
? ?   Benito Mccreedy D.Merril Abbe ?[facility] ?[place] ?Phone: [phone] ?  ?Assessment and Plan:   ?  ?1. DEGENERATIVE DISC DISEASE, LUMBAR SPINE ?2. Left lumbar radiculopathy ?3. Chronic bilateral low back pain with left-sided sciatica ?-Chronic with exacerbation, subsequent sports medicine visit ?- Chronic low back pain with history of L4-L5 fusion with acute flare of pain over the past 2 months.  No red flag symptoms, so no additional imaging at today's visit ?- Due to significant symptoms at today's visit, patient elected for methylprednisone/ketorolac IM injection at today's visit ?--Tomorrow, start meloxicam 15 mg daily x2 weeks.  If still having pain after 2 weeks, complete 3rd-week of meloxicam. May use remaining meloxicam as needed once daily for pain control.  Do not to use additional NSAIDs while taking meloxicam.  May use Tylenol [phone] mg 2 to 3 times a day for breakthrough pain.  ?  ?Pertinent previous records reviewed include lumbar MRI 07/24/2018 ?  ?Follow Up: 3 weeks for reevaluation.  If no improvement or worsening of symptoms, would obtain lumbar/pelvis x-ray ?  ?Subjective:   ?I, Pincus Badder, am serving as a Education administrator for [employer] ? ?Chief Complaint: back and hip pain  ? ?HPI:  ? ?02/06/22 ?Patient is a 61 year old female complaining of back and hip pain. Patient states that she must have twisted or turned the wrong way last time she dr Tamala Julian he gave her a shot and it felt better, been going on for 2 months on a dn off , no numbness or tingling just constant pain cant sit for more than 15-20 minutes now , radiating across the whole low back and down the back of her leg sometimes ? ?Relevant Historical Information: History of L4-5 lumbar fusion, lumbar DDD, left-sided sciatica ? ?Additional pertinent review of systems negative. ? ? ?Current Outpatient Medications:  ?  amLODipine (NORVASC) 5 MG tablet, Take 1 tablet (5 mg  total) by mouth at bedtime., Disp: 90 tablet, Rfl: 3 ?  aspirin EC 81 MG tablet, Take 81 mg by mouth daily. Swallow whole., Disp: , Rfl:  ?  meloxicam (MOBIC) 15 MG tablet, Take 1 tablet (15 mg total) by mouth daily., Disp: 30 tablet, Rfl: 0  ? ?Objective:   ?  ?Vitals:  ? 02/06/22 1113  ?BP: 138/80  ?Pulse: 72  ?SpO2: 97%  ?Weight: 191 lb (86.6 kg)  ?Height: '4\' 11"'$  (1.499 m)  ?  ?  ?Body mass index is 38.58 kg/m?.  ?  ?Physical Exam:   ? ?Gen: Appears well, nad, nontoxic and pleasant ?Psych: Alert and oriented, appropriate mood and affect ?Neuro: sensation intact, strength is 5/5 in upper and lower extremities, muscle tone wnl ?Skin: no susupicious lesions or rashes ? ?Back - Normal skin, Spine with normal alignment and no deformity.   ?No tenderness to vertebral process palpation.   ?Bilateral lumbar paraspinous muscles are moderately tender and without spasm ?Straight leg raise negative, though left leg raise caused low back pain without radicular symptoms ?Low back pain with lumbar extension ?- Unable to lift legs to perform piriformis test ? ? ?Electronically signed by:  ?Benito Mccreedy D.Merril Abbe ?[facility] ?11:28 AM 02/06/22 ?

## 2022-02-06 NOTE — Addendum Note (Signed)
Addended by: Judy Pimple R on: 02/06/2022 12:26 PM ? ? Modules accepted: Orders ? ?

## 2022-02-13 ENCOUNTER — Other Ambulatory Visit: Payer: Self-pay | Admitting: Nurse Practitioner

## 2022-02-13 DIAGNOSIS — I10 Essential (primary) hypertension: Secondary | ICD-10-CM

## 2022-02-14 ENCOUNTER — Ambulatory Visit: Payer: PRIVATE HEALTH INSURANCE | Admitting: Nurse Practitioner

## 2022-02-20 ENCOUNTER — Ambulatory Visit: Payer: 59 | Admitting: Family Medicine

## 2022-02-20 ENCOUNTER — Ambulatory Visit (INDEPENDENT_AMBULATORY_CARE_PROVIDER_SITE_OTHER): Payer: 59 | Admitting: Nurse Practitioner

## 2022-02-20 ENCOUNTER — Encounter: Payer: Self-pay | Admitting: Nurse Practitioner

## 2022-02-20 VITALS — BP 144/90 | HR 69 | Temp 96.5°F | Ht 59.0 in | Wt 198.0 lb

## 2022-02-20 DIAGNOSIS — E1169 Type 2 diabetes mellitus with other specified complication: Secondary | ICD-10-CM

## 2022-02-20 DIAGNOSIS — I10 Essential (primary) hypertension: Secondary | ICD-10-CM | POA: Diagnosis not present

## 2022-02-20 DIAGNOSIS — E1165 Type 2 diabetes mellitus with hyperglycemia: Secondary | ICD-10-CM

## 2022-02-20 DIAGNOSIS — E785 Hyperlipidemia, unspecified: Secondary | ICD-10-CM

## 2022-02-20 DIAGNOSIS — Z23 Encounter for immunization: Secondary | ICD-10-CM | POA: Diagnosis not present

## 2022-02-20 LAB — POCT GLYCOSYLATED HEMOGLOBIN (HGB A1C): Hemoglobin A1C: 6.5 % — AB (ref 4.0–5.6)

## 2022-02-20 MED ORDER — AMLODIPINE BESYLATE 5 MG PO TABS: ORAL_TABLET | ORAL | 3 refills | Status: DC

## 2022-02-20 MED ORDER — ATORVASTATIN CALCIUM 20 MG PO TABS: 20.0000 mg | ORAL_TABLET | Freq: Every day | ORAL | 3 refills | Status: DC

## 2022-02-20 MED ORDER — METFORMIN HCL ER 500 MG PO TB24: 500.0000 mg | ORAL_TABLET | Freq: Every day | ORAL | 3 refills | Status: DC

## 2022-02-20 NOTE — Assessment & Plan Note (Addendum)
BP at goal. She did not start amlodipine as previously prescribed. BP Readings from Last 3 Encounters:  02/20/22 (!) 144/90  02/06/22 138/80  11/05/21 138/88   Advised about the risk of uncontrolled HTN. She agreed to start amlodipine as prescribed.  Check BP at home every other day in AM Send BP sending via mychart in 2weeks. F/up in 1-25month

## 2022-02-20 NOTE — Patient Instructions (Addendum)
Schedule lab appt. Need to be fasting 8hrs prior to blood draw.  Schedule appt for diabetic eye exam once a year.  Resume amlodipine '5mg'$  at bedtime for hypertension. Start atorvastatin and metformin Take meds with food.  Check BP at home every other day in AM Send BP sending via mychart in 2weeks.  Diabetes Mellitus and Nutrition, Adult When you have diabetes, or diabetes mellitus, it is very important to have healthy eating habits because your blood sugar (glucose) levels are greatly affected by what you eat and drink. Eating healthy foods in the right amounts, at about the same times every day, can help you: Manage your blood glucose. Lower your risk of heart disease. Improve your blood pressure. Reach or maintain a healthy weight. What can affect my meal plan? Every person with diabetes is different, and each person has different needs for a meal plan. Your health care provider may recommend that you work with a dietitian to make a meal plan that is best for you. Your meal plan may vary depending on factors such as: The calories you need. The medicines you take. Your weight. Your blood glucose, blood pressure, and cholesterol levels. Your activity level. Other health conditions you have, such as heart or kidney disease. How do carbohydrates affect me? Carbohydrates, also called carbs, affect your blood glucose level more than any other type of food. Eating carbs raises the amount of glucose in your blood. It is important to know how many carbs you can safely have in each meal. This is different for every person. Your dietitian can help you calculate how many carbs you should have at each meal and for each snack. How does alcohol affect me? Alcohol can cause a decrease in blood glucose (hypoglycemia), especially if you use insulin or take certain diabetes medicines by mouth. Hypoglycemia can be a life-threatening condition. Symptoms of hypoglycemia, such as sleepiness, dizziness, and  confusion, are similar to symptoms of having too much alcohol. Do not drink alcohol if: Your health care provider tells you not to drink. You are pregnant, may be pregnant, or are planning to become pregnant. If you drink alcohol: Limit how much you have to: 0-1 drink a day for women. 0-2 drinks a day for men. Know how much alcohol is in your drink. In the [place] one drink equals one 12 oz bottle of beer (355 mL), one 5 oz glass of wine (148 mL), or one 1 oz glass of hard liquor (44 mL). Keep yourself hydrated with water, diet soda, or unsweetened iced tea. Keep in mind that regular soda, juice, and other mixers may contain a lot of sugar and must be counted as carbs. What are tips for following this plan?  Reading food labels Start by checking the serving size on the Nutrition Facts label of packaged foods and drinks. The number of calories and the amount of carbs, fats, and other nutrients listed on the label are based on one serving of the item. Many items contain more than one serving per package. Check the total grams (g) of carbs in one serving. Check the number of grams of saturated fats and trans fats in one serving. Choose foods that have a low amount or none of these fats. Check the number of milligrams (mg) of salt (sodium) in one serving. Most people should limit total sodium intake to less than 2,300 mg per day. Always check the nutrition information of foods labeled as "low-fat" or "nonfat." These foods may be higher in added  sugar or refined carbs and should be avoided. Talk to your dietitian to identify your daily goals for nutrients listed on the label. Shopping Avoid buying canned, pre-made, or processed foods. These foods tend to be high in fat, sodium, and added sugar. Shop around the outside edge of the grocery store. This is where you will most often find fresh fruits and vegetables, bulk grains, fresh meats, and fresh dairy products. Cooking Use low-heat cooking methods,  such as baking, instead of high-heat cooking methods, such as deep frying. Cook using healthy oils, such as olive, canola, or sunflower oil. Avoid cooking with butter, cream, or high-fat meats. Meal planning Eat meals and snacks regularly, preferably at the same times every day. Avoid going long periods of time without eating. Eat foods that are high in fiber, such as fresh fruits, vegetables, beans, and whole grains. Eat 4-6 oz (112-168 g) of lean protein each day, such as lean meat, chicken, fish, eggs, or tofu. One ounce (oz) (28 g) of lean protein is equal to: 1 oz (28 g) of meat, chicken, or fish. 1 egg.  cup (62 g) of tofu. Eat some foods each day that contain healthy fats, such as avocado, nuts, seeds, and fish. What foods should I eat? Fruits Berries. Apples. Oranges. Peaches. Apricots. Plums. Grapes. Mangoes. Papayas. Pomegranates. Kiwi. Cherries. Vegetables Leafy greens, including lettuce, spinach, kale, chard, collard greens, mustard greens, and cabbage. Beets. Cauliflower. Broccoli. Carrots. Green beans. Tomatoes. Peppers. Onions. Cucumbers. Brussels sprouts. Grains Whole grains, such as whole-wheat or whole-grain bread, crackers, tortillas, cereal, and pasta. Unsweetened oatmeal. Quinoa. Brown or wild rice. Meats and other proteins Seafood. Poultry without skin. Lean cuts of poultry and beef. Tofu. Nuts. Seeds. Dairy Low-fat or fat-free dairy products such as milk, yogurt, and cheese. The items listed above may not be a complete list of foods and beverages you can eat and drink. Contact a dietitian for more information. What foods should I avoid? Fruits Fruits canned with syrup. Vegetables Canned vegetables. Frozen vegetables with butter or cream sauce. Grains Refined white flour and flour products such as bread, pasta, snack foods, and cereals. Avoid all processed foods. Meats and other proteins Fatty cuts of meat. Poultry with skin. Breaded or fried meats. Processed  meat. Avoid saturated fats. Dairy Full-fat yogurt, cheese, or milk. Beverages Sweetened drinks, such as soda or iced tea. The items listed above may not be a complete list of foods and beverages you should avoid. Contact a dietitian for more information. Questions to ask a health care provider Do I need to meet with a certified diabetes care and education specialist? Do I need to meet with a dietitian? What number can I call if I have questions? When are the best times to check my blood glucose? Where to find more information: American Diabetes Association: diabetes.org Academy of Nutrition and Dietetics: eatright.[employer] of Diabetes and Digestive and Kidney Diseases: AmenCredit.is Association of Diabetes Care & Education Specialists: diabeteseducator.org Summary It is important to have healthy eating habits because your blood sugar (glucose) levels are greatly affected by what you eat and drink. It is important to use alcohol carefully. A healthy meal plan will help you manage your blood glucose and lower your risk of heart disease. Your health care provider may recommend that you work with a dietitian to make a meal plan that is best for you. This information is not intended to replace advice given to you by your health care provider. Make sure you discuss any questions  you have with your health care provider. Document Revised: 04/18/2020 Document Reviewed: 04/18/2020 Elsevier Patient Education  [place].  Diabetes Mellitus and Nutrition, Adult When you have diabetes, or diabetes mellitus, it is very important to have healthy eating habits because your blood sugar (glucose) levels are greatly affected by what you eat and drink. Eating healthy foods in the right amounts, at about the same times every day, can help you: Manage your blood glucose. Lower your risk of heart disease. Improve your blood pressure. Reach or maintain a healthy weight. What can affect my  meal plan? Every person with diabetes is different, and each person has different needs for a meal plan. Your health care provider may recommend that you work with a dietitian to make a meal plan that is best for you. Your meal plan may vary depending on factors such as: The calories you need. The medicines you take. Your weight. Your blood glucose, blood pressure, and cholesterol levels. Your activity level. Other health conditions you have, such as heart or kidney disease. How do carbohydrates affect me? Carbohydrates, also called carbs, affect your blood glucose level more than any other type of food. Eating carbs raises the amount of glucose in your blood. It is important to know how many carbs you can safely have in each meal. This is different for every person. Your dietitian can help you calculate how many carbs you should have at each meal and for each snack. How does alcohol affect me? Alcohol can cause a decrease in blood glucose (hypoglycemia), especially if you use insulin or take certain diabetes medicines by mouth. Hypoglycemia can be a life-threatening condition. Symptoms of hypoglycemia, such as sleepiness, dizziness, and confusion, are similar to symptoms of having too much alcohol. Do not drink alcohol if: Your health care provider tells you not to drink. You are pregnant, may be pregnant, or are planning to become pregnant. If you drink alcohol: Limit how much you have to: 0-1 drink a day for women. 0-2 drinks a day for men. Know how much alcohol is in your drink. In the [place] one drink equals one 12 oz bottle of beer (355 mL), one 5 oz glass of wine (148 mL), or one 1 oz glass of hard liquor (44 mL). Keep yourself hydrated with water, diet soda, or unsweetened iced tea. Keep in mind that regular soda, juice, and other mixers may contain a lot of sugar and must be counted as carbs. What are tips for following this plan?  Reading food labels Start by checking the serving  size on the Nutrition Facts label of packaged foods and drinks. The number of calories and the amount of carbs, fats, and other nutrients listed on the label are based on one serving of the item. Many items contain more than one serving per package. Check the total grams (g) of carbs in one serving. Check the number of grams of saturated fats and trans fats in one serving. Choose foods that have a low amount or none of these fats. Check the number of milligrams (mg) of salt (sodium) in one serving. Most people should limit total sodium intake to less than 2,300 mg per day. Always check the nutrition information of foods labeled as "low-fat" or "nonfat." These foods may be higher in added sugar or refined carbs and should be avoided. Talk to your dietitian to identify your daily goals for nutrients listed on the label. Shopping Avoid buying canned, pre-made, or processed foods. These foods tend  to be high in fat, sodium, and added sugar. Shop around the outside edge of the grocery store. This is where you will most often find fresh fruits and vegetables, bulk grains, fresh meats, and fresh dairy products. Cooking Use low-heat cooking methods, such as baking, instead of high-heat cooking methods, such as deep frying. Cook using healthy oils, such as olive, canola, or sunflower oil. Avoid cooking with butter, cream, or high-fat meats. Meal planning Eat meals and snacks regularly, preferably at the same times every day. Avoid going long periods of time without eating. Eat foods that are high in fiber, such as fresh fruits, vegetables, beans, and whole grains. Eat 4-6 oz (112-168 g) of lean protein each day, such as lean meat, chicken, fish, eggs, or tofu. One ounce (oz) (28 g) of lean protein is equal to: 1 oz (28 g) of meat, chicken, or fish. 1 egg.  cup (62 g) of tofu. Eat some foods each day that contain healthy fats, such as avocado, nuts, seeds, and fish. What foods should I  eat? Fruits Berries. Apples. Oranges. Peaches. Apricots. Plums. Grapes. Mangoes. Papayas. Pomegranates. Kiwi. Cherries. Vegetables Leafy greens, including lettuce, spinach, kale, chard, collard greens, mustard greens, and cabbage. Beets. Cauliflower. Broccoli. Carrots. Green beans. Tomatoes. Peppers. Onions. Cucumbers. Brussels sprouts. Grains Whole grains, such as whole-wheat or whole-grain bread, crackers, tortillas, cereal, and pasta. Unsweetened oatmeal. Quinoa. Brown or wild rice. Meats and other proteins Seafood. Poultry without skin. Lean cuts of poultry and beef. Tofu. Nuts. Seeds. Dairy Low-fat or fat-free dairy products such as milk, yogurt, and cheese. The items listed above may not be a complete list of foods and beverages you can eat and drink. Contact a dietitian for more information. What foods should I avoid? Fruits Fruits canned with syrup. Vegetables Canned vegetables. Frozen vegetables with butter or cream sauce. Grains Refined white flour and flour products such as bread, pasta, snack foods, and cereals. Avoid all processed foods. Meats and other proteins Fatty cuts of meat. Poultry with skin. Breaded or fried meats. Processed meat. Avoid saturated fats. Dairy Full-fat yogurt, cheese, or milk. Beverages Sweetened drinks, such as soda or iced tea. The items listed above may not be a complete list of foods and beverages you should avoid. Contact a dietitian for more information. Questions to ask a health care provider Do I need to meet with a certified diabetes care and education specialist? Do I need to meet with a dietitian? What number can I call if I have questions? When are the best times to check my blood glucose? Where to find more information: American Diabetes Association: diabetes.org Academy of Nutrition and Dietetics: eatright.[employer] of Diabetes and Digestive and Kidney Diseases: AmenCredit.is Association of Diabetes Care & Education  Specialists: diabeteseducator.org Summary It is important to have healthy eating habits because your blood sugar (glucose) levels are greatly affected by what you eat and drink. It is important to use alcohol carefully. A healthy meal plan will help you manage your blood glucose and lower your risk of heart disease. Your health care provider may recommend that you work with a dietitian to make a meal plan that is best for you. This information is not intended to replace advice given to you by your health care provider. Make sure you discuss any questions you have with your health care provider. Document Revised: 04/18/2020 Document Reviewed: 04/18/2020 Elsevier Patient Education  [place].

## 2022-02-20 NOTE — Assessment & Plan Note (Signed)
Repeat hgbA1c today at 6.4% Normal foot exam. Advised to schedule appt for DM eye exam.  Start metformin '500mg'$  XR daily F/up in 65month

## 2022-02-20 NOTE — Progress Notes (Signed)
Established Patient Visit  Patient: Jill Shah   DOB: Aug 24, 1961   61 y.o. Female  MRN: [id number] Visit Date: 02/20/2022  Subjective:    Chief Complaint  Patient presents with   Office Visit    F/u cholesterol No concerns Shingles vaccine given today   HPI Hyperlipidemia associated with type 2 diabetes mellitus ([place]) ASCVD risk at 22.5%. we discussed about her risk and the benefits of using a statin. She agreed to start atorvastatin '20mg'$  Rx sent.  HYPERTENSION, BENIGN ESSENTIAL BP at goal. She did not start amlodipine as previously prescribed. BP Readings from Last 3 Encounters:  02/20/22 (!) 144/90  02/06/22 138/80  11/05/21 138/88   Advised about the risk of uncontrolled HTN. She agreed to start amlodipine as prescribed.  Check BP at home every other day in AM Send BP sending via mychart in 2weeks. F/up in 1-60month  DM (diabetes mellitus) ([place] Repeat hgbA1c today at 6.4% Normal foot exam. Advised to schedule appt for DM eye exam.  Start metformin '500mg'$  XR daily F/up in 337month  Reviewed medical, surgical, and social history today  Medications: Outpatient Medications Prior to Visit  Medication Sig   aspirin EC 81 MG tablet Take 81 mg by mouth daily. Swallow whole.   meloxicam (MOBIC) 15 MG tablet Take 1 tablet (15 mg total) by mouth daily.   [DISCONTINUED] amLODipine (NORVASC) 5 MG tablet TAKE 1 TABLET(5 MG) BY MOUTH AT BEDTIME (Patient not taking: Reported on 02/20/2022)   No facility-administered medications prior to visit.   Reviewed past medical and social history.   ROS per HPI above      Objective:  BP (!) 144/90 (BP Location: Left Arm, Patient Position: Sitting, Cuff Size: Normal)   Pulse 69   Temp (!) 96.5 F (35.8 C) (Temporal)   Ht '4\' 11"'$  (1.499 m)   Wt 197 lb 15.7 oz (89.8 kg)   SpO2 96%   BMI 39.99 kg/m      Physical Exam Cardiovascular:     Rate and Rhythm: Normal rate.     Pulses: Normal pulses.   Pulmonary:     Effort: Pulmonary effort is normal.  Neurological:     Mental Status: She is alert and oriented to person, place, and time.  Psychiatric:        Mood and Affect: Mood normal.        Behavior: Behavior normal.        Thought Content: Thought content normal.    Results for orders placed or performed in visit on 02/20/22  POCT glycosylated hemoglobin (Hb A1C)  Result Value Ref Range   Hemoglobin A1C 6.5 (A) 4.0 - 5.6 %      Assessment & Plan:    Problem List Items Addressed This Visit       Cardiovascular and Mediastinum   HYPERTENSION, BENIGN ESSENTIAL - Primary    BP at goal. She did not start amlodipine as previously prescribed. BP Readings from Last 3 Encounters:  02/20/22 (!) 144/90  02/06/22 138/80  11/05/21 138/88   Advised about the risk of uncontrolled HTN. She agreed to start amlodipine as prescribed.  Check BP at home every other day in AM Send BP sending via mychart in 2weeks. F/up in 1-1m81month    Relevant Medications   amLODipine (NORVASC) 5 MG tablet   atorvastatin (LIPITOR) 20 MG tablet   Other Relevant Orders   Basic metabolic  panel     Endocrine   DM (diabetes mellitus) ([place])    Repeat hgbA1c today at 6.4% Normal foot exam. Advised to schedule appt for DM eye exam.  Start metformin '500mg'$  XR daily F/up in 29month       Relevant Medications   atorvastatin (LIPITOR) 20 MG tablet   metFORMIN (GLUCOPHAGE-XR) 500 MG 24 hr tablet   Other Relevant Orders   POCT glycosylated hemoglobin (Hb A1C) (Completed)   Basic metabolic panel   Microalbumin / creatinine urine ratio   Hyperlipidemia associated with type 2 diabetes mellitus ([place]    ASCVD risk at 22.5%. we discussed about her risk and the benefits of using a statin. She agreed to start atorvastatin '20mg'$  Rx sent.       Relevant Medications   amLODipine (NORVASC) 5 MG tablet   atorvastatin (LIPITOR) 20 MG tablet   metFORMIN (GLUCOPHAGE-XR) 500 MG 24 hr tablet   Other Relevant  Orders   Lipid panel   Other Visit Diagnoses     Need for shingles vaccine       Relevant Orders   Varicella-zoster vaccine IM (Shingrix) (Completed)      Return in about 3 months (around 05/23/2022) for  DM and HTN, hyperlipidemia (fasting).     CWilfred Lacy NP

## 2022-02-20 NOTE — Assessment & Plan Note (Signed)
ASCVD risk at 22.5%. we discussed about her risk and the benefits of using a statin. She agreed to start atorvastatin '20mg'$  Rx sent.

## 2022-02-25 ENCOUNTER — Other Ambulatory Visit (INDEPENDENT_AMBULATORY_CARE_PROVIDER_SITE_OTHER): Payer: 59

## 2022-02-25 ENCOUNTER — Other Ambulatory Visit: Payer: Self-pay

## 2022-02-25 DIAGNOSIS — E1169 Type 2 diabetes mellitus with other specified complication: Secondary | ICD-10-CM

## 2022-02-25 DIAGNOSIS — E1165 Type 2 diabetes mellitus with hyperglycemia: Secondary | ICD-10-CM | POA: Diagnosis not present

## 2022-02-25 DIAGNOSIS — E785 Hyperlipidemia, unspecified: Secondary | ICD-10-CM

## 2022-02-25 DIAGNOSIS — I10 Essential (primary) hypertension: Secondary | ICD-10-CM

## 2022-02-25 LAB — LIPID PANEL
Cholesterol: 273 mg/dL — ABNORMAL HIGH (ref 0–200)
HDL: 62.8 mg/dL (ref >39.00)
LDL Cholesterol: 187 mg/dL — ABNORMAL HIGH (ref 0–99)
NonHDL: 209.73
Total CHOL/HDL Ratio: 4
Triglycerides: 112 mg/dL (ref 0.0–149.0)
VLDL: 22.4 mg/dL (ref 0.0–40.0)

## 2022-02-25 LAB — BASIC METABOLIC PANEL
BUN: 10 mg/dL (ref 6–23)
CO2: 29 mEq/L (ref 19–32)
Calcium: 9 mg/dL (ref 8.4–10.5)
Chloride: 103 mEq/L (ref 96–112)
Creatinine, Ser: 0.85 mg/dL (ref 0.40–1.20)
GFR: 74.67 mL/min (ref >60.00)
Glucose, Bld: 111 mg/dL — ABNORMAL HIGH (ref 70–99)
Potassium: 3.5 mEq/L (ref 3.5–5.1)
Sodium: 140 mEq/L (ref 135–145)

## 2022-02-26 LAB — MICROALBUMIN / CREATININE URINE RATIO
Creatinine,U: 381.1 mg/dL
Microalb Creat Ratio: 0.8 mg/g (ref 0.0–30.0)
Microalb, Ur: 3 mg/dL — ABNORMAL HIGH (ref 0.0–1.9)

## 2022-02-27 ENCOUNTER — Telehealth: Payer: Self-pay | Admitting: Nurse Practitioner

## 2022-02-27 DIAGNOSIS — E1169 Type 2 diabetes mellitus with other specified complication: Secondary | ICD-10-CM

## 2022-02-27 MED ORDER — LISINOPRIL 2.5 MG PO TABS: 2.5000 mg | ORAL_TABLET | Freq: Every day | ORAL | 1 refills | Status: DC

## 2022-02-27 NOTE — Telephone Encounter (Signed)
-----   Message from Lucillie Garfinkel, [place] sent at 02/27/2022 10:15 AM EDT ----- Pt advised & voiced understanding. Is okay with adding lisinopril. 1 month f/u scheduled. ----- Message ----- From: Flossie Buffy, NP Sent: 02/27/2022   8:39 AM EDT To: Charolette Child Wall, CMA  Stable renal function Positive protein in urine due to DM: recommend adding low dose lisinopril for renal protection. Are you ok with that? Abnormal lipid panel: LDL is not at goal with atorvastatin. Start atorvastatin as prescribed Schedule 37monthF/up appt

## 2022-03-10 ENCOUNTER — Other Ambulatory Visit: Payer: Self-pay | Admitting: Sports Medicine

## 2022-03-11 MED ORDER — MELOXICAM 15 MG PO TABS: 15.0000 mg | ORAL_TABLET | Freq: Every day | ORAL | 0 refills | Status: DC

## 2022-03-12 ENCOUNTER — Ambulatory Visit: Payer: 59 | Admitting: Family Medicine

## 2022-04-07 ENCOUNTER — Encounter: Payer: Self-pay | Admitting: Nurse Practitioner

## 2022-04-07 ENCOUNTER — Ambulatory Visit (INDEPENDENT_AMBULATORY_CARE_PROVIDER_SITE_OTHER): Payer: 59 | Admitting: Nurse Practitioner

## 2022-04-07 VITALS — BP 138/82 | HR 67 | Temp 96.9°F | Ht 59.0 in | Wt 195.4 lb

## 2022-04-07 DIAGNOSIS — E1169 Type 2 diabetes mellitus with other specified complication: Secondary | ICD-10-CM | POA: Diagnosis not present

## 2022-04-07 DIAGNOSIS — E785 Hyperlipidemia, unspecified: Secondary | ICD-10-CM | POA: Diagnosis not present

## 2022-04-07 LAB — LIPID PANEL
Cholesterol: 266 mg/dL — ABNORMAL HIGH (ref 0–200)
HDL: 61.2 mg/dL (ref >39.00)
LDL Cholesterol: 186 mg/dL — ABNORMAL HIGH (ref 0–99)
NonHDL: 204.91
Total CHOL/HDL Ratio: 4
Triglycerides: 96 mg/dL (ref 0.0–149.0)
VLDL: 19.2 mg/dL (ref 0.0–40.0)

## 2022-04-07 MED ORDER — ATORVASTATIN CALCIUM 40 MG PO TABS: 40.0000 mg | ORAL_TABLET | Freq: Every day | ORAL | 3 refills | Status: DC

## 2022-04-07 NOTE — Assessment & Plan Note (Addendum)
No adverse effects with atorvastatin Repeat lipid panel: LDL not at goal. Increase atorvastatin to '40mg'$  Repeat labs in 3-68month (fasting)

## 2022-04-07 NOTE — Progress Notes (Signed)
                Established Patient Visit  Patient: Jill Shah   DOB: 1960-11-24   61 y.o. Female  MRN: [id number] Visit Date: 04/07/2022  Subjective:    Chief Complaint  Patient presents with   Office Visit    1 month f/u DM Doesn't check blood sugars often, when she does, it is below 100 No concerns today Requesting records for eye exam   HPI DM (diabetes mellitus) ([place]) Fasting glucose (90-100) No adverse effects from metformin Advised to schedule appt with ophthalmology Normal urine microalbumin Last hgbA1c at 6.5%  Maintain metformin dose F/up in 27month  Hyperlipidemia associated with type 2 diabetes mellitus ([place] No adverse effects with atorvastatin Repeat lipid panel   Reviewed medical, surgical, and social history today  Medications: Outpatient Medications Prior to Visit  Medication Sig   amLODipine (NORVASC) 5 MG tablet TAKE 1 TABLET(5 MG) BY MOUTH AT BEDTIME   aspirin EC 81 MG tablet Take 81 mg by mouth daily. Swallow whole.   atorvastatin (LIPITOR) 20 MG tablet Take 1 tablet (20 mg total) by mouth daily.   lisinopril (ZESTRIL) 2.5 MG tablet Take 1 tablet (2.5 mg total) by mouth daily.   meloxicam (MOBIC) 15 MG tablet Take 1 tablet (15 mg total) by mouth daily.   metFORMIN (GLUCOPHAGE-XR) 500 MG 24 hr tablet Take 1 tablet (500 mg total) by mouth daily with breakfast.   No facility-administered medications prior to visit.   Reviewed past medical and social history.   ROS per HPI above      Objective:  BP 138/82 (BP Location: Right Arm, Patient Position: Sitting, Cuff Size: Normal)   Pulse 67   Temp (!) 96.9 F (36.1 C) (Temporal)   Ht '4\' 11"'$  (1.499 m)   Wt 195 lb 6.4 oz (88.6 kg)   SpO2 99%   BMI 39.47 kg/m      Physical Exam Vitals reviewed.  Cardiovascular:     Rate and Rhythm: Normal rate and regular rhythm.     Pulses: Normal pulses.     Heart sounds: Normal heart sounds.  Pulmonary:     Effort: Pulmonary effort is  normal.     Breath sounds: Normal breath sounds.  Musculoskeletal:     Right lower leg: No edema.     Left lower leg: No edema.  Neurological:     Mental Status: She is alert and oriented to person, place, and time.     No results found for any visits on 04/07/22.    Assessment & Plan:    Problem List Items Addressed This Visit       Endocrine   DM (diabetes mellitus) ([place] Primary    Fasting glucose (90-100) No adverse effects from metformin Advised to schedule appt with ophthalmology Normal urine microalbumin Last hgbA1c at 6.5%  Maintain metformin dose F/up in 36month     Hyperlipidemia associated with type 2 diabetes mellitus ([place]   No adverse effects with atorvastatin Repeat lipid panel      Relevant Orders   Lipid panel   Return in about 3 months (around 07/08/2022) for DM and HTN, hyperlipidemia (fasting).     ChWilfred LacyNP

## 2022-04-07 NOTE — Addendum Note (Signed)
Addended by: Leana Gamer on: 04/07/2022 04:39 PM   Modules accepted: Orders

## 2022-04-07 NOTE — Patient Instructions (Signed)
Schedule appt for diabetic eye exam. Have report faxed to me.  Go to lab Maintain current medications

## 2022-04-07 NOTE — Assessment & Plan Note (Addendum)
Fasting glucose (90-100) No adverse effects from metformin Advised to schedule appt with ophthalmology Normal urine microalbumin Last hgbA1c at 6.5%  Maintain metformin dose F/up in 3month

## 2022-04-14 NOTE — Progress Notes (Deleted)
[place] Phone: [phone] Subjective:    I'm seeing this patient by the request  of:  Nche, Charlene Brooke, NP  CC: low back pain   PIR:[id number]  02/06/2022 1. DEGENERATIVE DISC DISEASE, LUMBAR SPINE 2. Left lumbar radiculopathy 3. Chronic bilateral low back pain with left-sided sciatica -Chronic with exacerbation, subsequent sports medicine visit - Chronic low back pain with history of L4-L5 fusion with acute flare of pain over the past 2 months.  No red flag symptoms, so no additional imaging at today's visit - Due to significant symptoms at today's visit, patient elected for methylprednisone/ketorolac IM injection at today's visit --Tomorrow, start meloxicam 15 mg daily x2 weeks.  If still having pain after 2 weeks, complete 3rd-week of meloxicam. May use remaining meloxicam as needed once daily for pain control.  Do not to use additional NSAIDs while taking meloxicam.  May use Tylenol [phone] mg 2 to 3 times a day for breakthrough pain.    Pertinent previous records reviewed include lumbar MRI 07/24/2018   Follow Up: 3 weeks for reevaluation.  If no improvement or worsening of symptoms, would obtain lumbar/pelvis x-ray  Last seen by Dr. Tamala Julian in 2019  Updated 04/15/2022 Jill Shah is a 61 y.o. female coming in with complaint of back pain.  Onset-  Location Duration-  Character- Aggravating factors- Reliving factors-  Therapies tried-  Severity-     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   Injected in 09/25/2016   Hypercholesterolemia    Hypertension    Lateral epicondylitis of right elbow 11/19/2015   Left knee pain 06/03/2018   Left leg swelling 06/03/2018   Left shoulder pain 09/09/2016   Lumbar radiculopathy 01/27/2017   Right elbow pain 11/08/2015   UTI (urinary tract infection) 12/25/2017   Past Surgical History:  Procedure Laterality Date    CESAREAN SECTION     x2   fibroid ablation     TUBAL LIGATION     Social History   Socioeconomic History   Marital status: Married    Spouse name: Not on file   Number of children: Not on file   Years of education: Not on file   Highest education level: Not on file  Occupational History   Not on file  Tobacco Use   Smoking status: Never   Smokeless tobacco: Never  Vaping Use   Vaping Use: Never used  Substance and Sexual Activity   Alcohol use: No    Alcohol/week: 0.0 standard drinks of alcohol   Drug use: No   Sexual activity: Not on file  Other Topics Concern   Not on file  Social History Narrative   Not on file   Social Determinants of Health   Financial Resource Strain: Not on file  Food Insecurity: Not on file  Transportation Needs: Not on file  Physical Activity: Not on file  Stress: Not on file  Social Connections: Not on file   No Known Allergies Family History  Problem Relation Age of Onset   Diabetes Mother    Lung cancer Mother    Pancreatic cancer Sister    Other Brother        aids   Colon cancer Neg Hx    Colon polyps Neg Hx     Current Outpatient Medications (Endocrine & Metabolic):    metFORMIN (GLUCOPHAGE-XR) 500 MG 24 hr tablet, Take 1 tablet (500 mg total) by mouth  daily with breakfast.  Current Outpatient Medications (Cardiovascular):    amLODipine (NORVASC) 5 MG tablet, TAKE 1 TABLET(5 MG) BY MOUTH AT BEDTIME   atorvastatin (LIPITOR) 40 MG tablet, Take 1 tablet (40 mg total) by mouth daily.   lisinopril (ZESTRIL) 2.5 MG tablet, Take 1 tablet (2.5 mg total) by mouth daily.   Current Outpatient Medications (Analgesics):    aspirin EC 81 MG tablet, Take 81 mg by mouth daily. Swallow whole.   meloxicam (MOBIC) 15 MG tablet, Take 1 tablet (15 mg total) by mouth daily.     Reviewed prior external information including notes and imaging from  primary care provider As well as notes that were available from care everywhere and other  healthcare systems.  Past medical history, social, surgical and family history all reviewed in electronic medical record.  No pertanent information unless stated regarding to the chief complaint.   Review of Systems:  No headache, visual changes, nausea, vomiting, diarrhea, constipation, dizziness, abdominal pain, skin rash, fevers, chills, night sweats, weight loss, swollen lymph nodes, body aches, joint swelling, chest pain, shortness of breath, mood changes. POSITIVE muscle aches  Objective  There were no vitals taken for this visit.   General: No apparent distress alert and oriented x3 mood and affect normal, dressed appropriately.  HEENT: Pupils equal, extraocular movements intact  Respiratory: Patient's speak in full sentences and does not appear short of breath  Cardiovascular: No lower extremity edema, non tender, no erythema      Impression and Recommendations:    The above documentation has been reviewed and is accurate and complete Lyndal Pulley, DO

## 2022-04-15 ENCOUNTER — Ambulatory Visit: Payer: 59 | Admitting: Family Medicine

## 2022-05-14 NOTE — Progress Notes (Deleted)
[place] Phone: [phone] Subjective:    I'm seeing this patient by the request  of:  Nche, Charlene Brooke, NP  CC:   [id number]  Taviana Westergren Scudder-Francis is a 61 y.o. female coming in with complaint of L sided lumbar radiculopathology. Last seen in 2019. Epidural October 2019. Patient states   MRI October 2019 lumbar IMPRESSION: 1. Prior decompression and fusion at L4-L5 with no adverse features.   2. However, progressed and severe spinal degeneration at the adjacent segments: - L3-L4 disc space loss with new subtle anterolisthesis and severe posterior element degeneration. Subsequent new moderate spinal stenosis with right greater than left L4 nerve level lateral recess stenosis and moderate to severe left greater than right L3 foraminal stenosis. - L5-S1 severe disc space loss with bulky circumferential disc extrusion. New severe left S1 lateral recess and bilateral L5 foraminal stenosis with moderate to severe right S1 level stenosis and mild overall spinal stenosis.   3. Chronic retrolisthesis at L2-L3 with mildly progressed disc degeneration, new mild spinal stenosis and mild to moderate right L2 foraminal stenosis.   4. Chronic anterolisthesis at T11-T12 with severe posterior element hypertrophy. Stable mild spinal stenosis but increased chronic T11 foraminal stenosis, worse on the left.    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   Injected in 09/25/2016   Hypercholesterolemia    Hypertension    Lateral epicondylitis of right elbow 11/19/2015   Left knee pain 06/03/2018   Left leg swelling 06/03/2018   Left shoulder pain 09/09/2016   Lumbar radiculopathy 01/27/2017   Right elbow pain 11/08/2015   UTI (urinary tract infection) 12/25/2017   Past Surgical History:  Procedure Laterality Date   CESAREAN SECTION     x2   fibroid ablation     TUBAL LIGATION      Social History   Socioeconomic History   Marital status: Married    Spouse name: Not on file   Number of children: Not on file   Years of education: Not on file   Highest education level: Not on file  Occupational History   Not on file  Tobacco Use   Smoking status: Never   Smokeless tobacco: Never  Vaping Use   Vaping Use: Never used  Substance and Sexual Activity   Alcohol use: No    Alcohol/week: 0.0 standard drinks of alcohol   Drug use: No   Sexual activity: Not on file  Other Topics Concern   Not on file  Social History Narrative   Not on file   Social Determinants of Health   Financial Resource Strain: Not on file  Food Insecurity: Not on file  Transportation Needs: Not on file  Physical Activity: Not on file  Stress: Not on file  Social Connections: Not on file   No Known Allergies Family History  Problem Relation Age of Onset   Diabetes Mother    Lung cancer Mother    Pancreatic cancer Sister    Other Brother        aids   Colon cancer Neg Hx    Colon polyps Neg Hx     Current Outpatient Medications (Endocrine & Metabolic):    metFORMIN (GLUCOPHAGE-XR) 500 MG 24 hr tablet, Take 1 tablet (500 mg total) by mouth daily with breakfast.  Current Outpatient Medications (Cardiovascular):    amLODipine (NORVASC) 5 MG tablet, TAKE 1 TABLET(5 MG) BY MOUTH AT BEDTIME  atorvastatin (LIPITOR) 40 MG tablet, Take 1 tablet (40 mg total) by mouth daily.   lisinopril (ZESTRIL) 2.5 MG tablet, Take 1 tablet (2.5 mg total) by mouth daily.   Current Outpatient Medications (Analgesics):    aspirin EC 81 MG tablet, Take 81 mg by mouth daily. Swallow whole.   meloxicam (MOBIC) 15 MG tablet, Take 1 tablet (15 mg total) by mouth daily.     Reviewed prior external information including notes and imaging from  primary care provider As well as notes that were available from care everywhere and other healthcare systems.  Past medical history, social, surgical and  family history all reviewed in electronic medical record.  No pertanent information unless stated regarding to the chief complaint.   Review of Systems:  No headache, visual changes, nausea, vomiting, diarrhea, constipation, dizziness, abdominal pain, skin rash, fevers, chills, night sweats, weight loss, swollen lymph nodes, body aches, joint swelling, chest pain, shortness of breath, mood changes. POSITIVE muscle aches  Objective  There were no vitals taken for this visit.   General: No apparent distress alert and oriented x3 mood and affect normal, dressed appropriately.  HEENT: Pupils equal, extraocular movements intact  Respiratory: Patient's speak in full sentences and does not appear short of breath  Cardiovascular: No lower extremity edema, non tender, no erythema      Impression and Recommendations:

## 2022-05-15 ENCOUNTER — Ambulatory Visit: Payer: 59 | Admitting: Family Medicine

## 2022-05-23 ENCOUNTER — Ambulatory Visit: Payer: 59 | Admitting: Nurse Practitioner

## 2022-06-18 NOTE — Progress Notes (Signed)
  Jill Shah Phone: [phone] Subjective:    I'm seeing this patient by the request  of:  Nche, Charlene Brooke, NP  CC: Canceled

## 2022-06-19 ENCOUNTER — Ambulatory Visit (INDEPENDENT_AMBULATORY_CARE_PROVIDER_SITE_OTHER): Payer: 59 | Admitting: Family Medicine

## 2022-07-08 ENCOUNTER — Ambulatory Visit: Payer: 59 | Admitting: Nurse Practitioner

## 2022-07-09 ENCOUNTER — Ambulatory Visit: Payer: 59 | Admitting: Family Medicine

## 2022-07-21 ENCOUNTER — Encounter: Payer: Self-pay | Admitting: Nurse Practitioner

## 2022-07-21 ENCOUNTER — Ambulatory Visit: Payer: 59 | Admitting: Nurse Practitioner

## 2022-07-21 VITALS — BP 140/82 | HR 71 | Temp 97.8°F | Ht 59.0 in | Wt 195.8 lb

## 2022-07-21 DIAGNOSIS — E785 Hyperlipidemia, unspecified: Secondary | ICD-10-CM

## 2022-07-21 DIAGNOSIS — E876 Hypokalemia: Secondary | ICD-10-CM | POA: Diagnosis not present

## 2022-07-21 DIAGNOSIS — I10 Essential (primary) hypertension: Secondary | ICD-10-CM | POA: Diagnosis not present

## 2022-07-21 DIAGNOSIS — E1169 Type 2 diabetes mellitus with other specified complication: Secondary | ICD-10-CM | POA: Diagnosis not present

## 2022-07-21 DIAGNOSIS — Z23 Encounter for immunization: Secondary | ICD-10-CM

## 2022-07-21 LAB — LIPID PANEL
Cholesterol: 185 mg/dL (ref 0–200)
HDL: 51.2 mg/dL (ref >39.00)
LDL Cholesterol: 114 mg/dL — ABNORMAL HIGH (ref 0–99)
NonHDL: 133.78
Total CHOL/HDL Ratio: 4
Triglycerides: 100 mg/dL (ref 0.0–149.0)
VLDL: 20 mg/dL (ref 0.0–40.0)

## 2022-07-21 LAB — BASIC METABOLIC PANEL
BUN: 9 mg/dL (ref 6–23)
CO2: 29 mEq/L (ref 19–32)
Calcium: 8.7 mg/dL (ref 8.4–10.5)
Chloride: 102 mEq/L (ref 96–112)
Creatinine, Ser: 0.79 mg/dL (ref 0.40–1.20)
GFR: 81.3 mL/min (ref >60.00)
Glucose, Bld: 105 mg/dL — ABNORMAL HIGH (ref 70–99)
Potassium: 3.3 mEq/L — ABNORMAL LOW (ref 3.5–5.1)
Sodium: 138 mEq/L (ref 135–145)

## 2022-07-21 LAB — HEMOGLOBIN A1C: Hgb A1c MFr Bld: 6.7 % — ABNORMAL HIGH (ref 4.6–6.5)

## 2022-07-21 MED ORDER — LISINOPRIL 5 MG PO TABS: 5.0000 mg | ORAL_TABLET | Freq: Every day | ORAL | 1 refills | Status: DC

## 2022-07-21 MED ORDER — POTASSIUM CHLORIDE CRYS ER 20 MEQ PO TBCR: 20.0000 meq | EXTENDED_RELEASE_TABLET | Freq: Two times a day (BID) | ORAL | 0 refills | Status: DC

## 2022-07-21 NOTE — Assessment & Plan Note (Signed)
No adverse effects with atorvastatin Repeat lipid panel: Improving lipid panel: maintain atorvastatin dose  Normal renal function but low potassium: sent potassium supplement

## 2022-07-21 NOTE — Assessment & Plan Note (Addendum)
No glucose check at home No adverse effect with metformin  Repeat HgA1c: 6.7% from 6.5%: maintain metformin dose.  It is important to also maintain a low carb/low fat/low sugar diet F/up in 27month

## 2022-07-21 NOTE — Progress Notes (Signed)
Established Patient Visit  Patient: Jill Shah   DOB: [birth year]   61 y.o. Female  MRN: [id number] Visit Date: 07/21/2022  Subjective:    Chief Complaint  Patient presents with   URI    HTN/Hyperlipidemia/DM Pt fasting  Checks BP twice a week  Doesn't check BS  No concerns  Shingles vaccine given today    HPI HYPERTENSION, BENIGN ESSENTIAL Elevated BP today No home BP readings Reports she is completed with amlodipine dose, but did not start lisinopril as prescribed BP Readings from Last 3 Encounters:  07/21/22 (!) 140/82  04/07/22 138/82  02/20/22 (!) 144/90   Maintain amlodipine dose Sent lisinopril '5mg'$  Repeat BMP: Normal renal function but low potassium: sent potassium supplement  DM (diabetes mellitus) (HCC) No glucose check at home No adverse effect with metformin  Repeat HgA1c: 6.7% from 6.5%: maintain metformin dose.  It is important to also maintain a low carb/low fat/low sugar diet F/up in 71month     Hyperlipidemia associated with type 2 diabetes mellitus (HCC) No adverse effects with atorvastatin Repeat lipid panel: Improving lipid panel: maintain atorvastatin dose  Normal renal function but low potassium: sent potassium supplement   Reviewed medical, surgical, and social history today  Medications: Outpatient Medications Prior to Visit  Medication Sig   amLODipine (NORVASC) 5 MG tablet TAKE 1 TABLET(5 MG) BY MOUTH AT BEDTIME   aspirin EC 81 MG tablet Take 81 mg by mouth daily. Swallow whole.   atorvastatin (LIPITOR) 40 MG tablet Take 1 tablet (40 mg total) by mouth daily.   metFORMIN (GLUCOPHAGE-XR) 500 MG 24 hr tablet Take 1 tablet (500 mg total) by mouth daily with breakfast.   [DISCONTINUED] lisinopril (ZESTRIL) 2.5 MG tablet Take 1 tablet (2.5 mg total) by mouth daily.   [DISCONTINUED] meloxicam (MOBIC) 15 MG tablet Take 1 tablet (15 mg total) by mouth daily. (Patient not taking: Reported on 07/21/2022)   No  facility-administered medications prior to visit.   Reviewed past medical and social history.   ROS per HPI above      Objective:  BP (!) 140/82   Pulse 71   Temp 97.8 F (36.6 C) (Temporal)   Ht '4\' 11"'$  (1.499 m)   Wt 195 lb 12.8 oz (88.8 kg)   SpO2 96%   BMI 39.55 kg/m      Physical Exam Cardiovascular:     Rate and Rhythm: Normal rate.     Pulses: Normal pulses.  Pulmonary:     Effort: Pulmonary effort is normal.  Neurological:     Mental Status: She is alert and oriented to person, place, and time.     Results for orders placed or performed in visit on 176/16/07 Basic metabolic panel  Result Value Ref Range   Sodium 138 135 - 145 mEq/L   Potassium 3.3 (L) 3.5 - 5.1 mEq/L   Chloride 102 96 - 112 mEq/L   CO2 29 19 - 32 mEq/L   Glucose, Bld 105 (H) 70 - 99 mg/dL   BUN 9 6 - 23 mg/dL   Creatinine, Ser 0.79 0.40 - 1.20 mg/dL   GFR 81.30 >60.00 mL/min   Calcium 8.7 8.4 - 10.5 mg/dL  Lipid panel  Result Value Ref Range   Cholesterol 185 0 - 200 mg/dL   Triglycerides 100.0 0.0 - 149.0 mg/dL   HDL 51.20 >39.00 mg/dL   VLDL 20.0 0.0 - 40.0 mg/dL  LDL Cholesterol 114 (H) 0 - 99 mg/dL   Total CHOL/HDL Ratio 4    NonHDL 133.78   Hemoglobin A1c  Result Value Ref Range   Hgb A1c MFr Bld 6.7 (H) 4.6 - 6.5 %      Assessment & Plan:    Problem List Items Addressed This Visit       Cardiovascular and Mediastinum   HYPERTENSION, BENIGN ESSENTIAL - Primary    Elevated BP today No home BP readings Reports she is completed with amlodipine dose, but did not start lisinopril as prescribed BP Readings from Last 3 Encounters:  07/21/22 (!) 140/82  04/07/22 138/82  02/20/22 (!) 144/90   Maintain amlodipine dose Sent lisinopril '5mg'$  Repeat BMP: Normal renal function but low potassium: sent potassium supplement      Relevant Medications   lisinopril (ZESTRIL) 5 MG tablet   Other Relevant Orders   Basic metabolic panel (Completed)     Endocrine   DM (diabetes  mellitus) ([place])    No glucose check at home No adverse effect with metformin  Repeat HgA1c: 6.7% from 6.5%: maintain metformin dose.  It is important to also maintain a low carb/low fat/low sugar diet F/up in 63month         Relevant Medications   lisinopril (ZESTRIL) 5 MG tablet   Other Relevant Orders   Basic metabolic panel (Completed)   Hemoglobin A1c (Completed)   Hyperlipidemia associated with type 2 diabetes mellitus [place]    No adverse effects with atorvastatin Repeat lipid panel: Improving lipid panel: maintain atorvastatin dose  Normal renal function but low potassium: sent potassium supplement       Relevant Medications   lisinopril (ZESTRIL) 5 MG tablet   Other Relevant Orders   Lipid panel (Completed)   Other Visit Diagnoses     Need for shingles vaccine       Relevant Orders   Zoster Recombinant (Shingrix ) (Completed)   Hypokalemia       Relevant Medications   potassium chloride SA (KLOR-CON M) 20 MEQ tablet      Return in about 4 weeks (around 08/18/2022) for HTN.     CWilfred Lacy NP

## 2022-07-21 NOTE — Patient Instructions (Addendum)
Go to lab Increase lisinopril dose to '5mg'$  in AM Maintain amlodipine dose at '5mg'$  at bedtime Maintain low sodium diet

## 2022-07-21 NOTE — Assessment & Plan Note (Addendum)
Elevated BP today No home BP readings Reports she is completed with amlodipine dose, but did not start lisinopril as prescribed BP Readings from Last 3 Encounters:  07/21/22 (!) 140/82  04/07/22 138/82  02/20/22 (!) 144/90   Maintain amlodipine dose Sent lisinopril '5mg'$  Repeat BMP: Normal renal function but low potassium: sent potassium supplement

## 2022-07-22 NOTE — Progress Notes (Deleted)
[place] Phone: [phone] Subjective:    I'm seeing this patient by the request  of:  Nche, Charlene Brooke, NP  CC:   [id number]  Jill Shah is a 61 y.o. female coming in with complaint of hip and back. Last seen in 2019 for back pain. 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 Tolerated the procedure well. We discussed icing regimen and home exercises. We discussed which activities to do in which ones to avoid. Patient will try topical anti-inflammatories. Once weekly vitamin D given for muscle strength and endurance. Follow-up again in 4 weeks. Worsening symptoms consider physical therapy.   UTI (urinary tract infection) 12/25/2017   Past Surgical History:  Procedure Laterality Date   CESAREAN SECTION     x2   fibroid ablation     TUBAL LIGATION     Social History   Socioeconomic History   Marital status: Married    Spouse name: Not on file   Number of children: Not on file   Years of education: Not on file   Highest education level: Not on file  Occupational History   Not on file  Tobacco Use   Smoking status: Never   Smokeless tobacco: Never  Vaping Use   Vaping Use: Never used  Substance and Sexual Activity   Alcohol use: No    Alcohol/week: 0.0 standard drinks of alcohol   Drug use: No   Sexual activity: Not on file  Other Topics Concern   Not on file  Social History Narrative   Not on file   Social  Determinants of Health   Financial Resource Strain: Not on file  Food Insecurity: Not on file  Transportation Needs: Not on file  Physical Activity: Not on file  Stress: Not on file  Social Connections: Not on file   No Known Allergies Family History  Problem Relation Age of Onset   Diabetes Mother    Lung cancer Mother    Pancreatic cancer Sister    Other Brother        aids   Colon cancer Neg Hx    Colon polyps Neg Hx     Current Outpatient Medications (Endocrine & Metabolic):    metFORMIN (GLUCOPHAGE-XR) 500 MG 24 hr tablet, Take 1 tablet (500 mg total) by mouth daily with breakfast.  Current Outpatient Medications (Cardiovascular):    amLODipine (NORVASC) 5 MG tablet, TAKE 1 TABLET(5 MG) BY MOUTH AT BEDTIME   atorvastatin (LIPITOR) 40 MG tablet, Take 1 tablet (40 mg total) by mouth daily.   lisinopril (ZESTRIL) 5 MG tablet, Take 1 tablet (5 mg total) by mouth daily.   Current Outpatient Medications (Analgesics):    aspirin EC 81 MG tablet, Take 81 mg by mouth daily. Swallow whole.   Current Outpatient Medications (Other):    potassium chloride SA (KLOR-CON M) 20 MEQ tablet, Take 1 tablet (20 mEq total) by mouth 2 (two) times daily.  Reviewed prior external information including notes and imaging from  primary care provider As well as notes that were available from care everywhere and other healthcare systems.  Past medical history, social, surgical and family history all reviewed in electronic medical record.  No pertanent information unless stated regarding to the chief complaint.   Review of Systems:  No headache, visual changes, nausea, vomiting, diarrhea, constipation, dizziness, abdominal pain, skin rash, fevers, chills, night sweats, weight loss, swollen lymph nodes, body aches, joint swelling, chest pain, shortness of breath, mood changes. POSITIVE muscle aches  Objective  There were no vitals taken for this visit.   General: No apparent distress alert  and oriented x3 mood and affect normal, dressed appropriately.  HEENT: Pupils equal, extraocular movements intact  Respiratory: Patient's speak in full sentences and does not appear short of breath  Cardiovascular: No lower extremity edema, non tender, no erythema      Impression and Recommendations:

## 2022-07-23 ENCOUNTER — Ambulatory Visit: Payer: 59 | Admitting: Family Medicine

## 2022-08-13 ENCOUNTER — Ambulatory Visit
Admission: EM | Admit: 2022-08-13 | Discharge: 2022-08-13 | Disposition: A | Discharge status: Home / Self Care | Payer: 59 | Attending: Physician Assistant | Admitting: Physician Assistant

## 2022-08-13 DIAGNOSIS — M25511 Pain in right shoulder: Secondary | ICD-10-CM

## 2022-08-13 MED ORDER — PREDNISONE 20 MG PO TABS: 40.0000 mg | ORAL_TABLET | Freq: Every day | ORAL | 0 refills | Status: AC

## 2022-08-13 NOTE — ED Provider Notes (Signed)
[name]    CSN: [id number] Arrival date & time: 08/13/22  1459      History   Chief Complaint Chief Complaint  Patient presents with   right shoulder pain    HPI Jill Shah is a 61 y.o. female.   Patient here today for evaluation of right shoulder pain that started recently.  She denies any injury that she is aware of.  She notes that pain is present with activity but also without activity at times.  She denies any shortness of breath or chest pain.  She has not had any lightheadedness.  She denies any numbness or tingling.  She has tried over-the-counter medication without resolution.  The history is provided by the patient.    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 Tolerated the procedure well. We discussed icing regimen and home exercises. We discussed which activities to do in which ones to avoid. Patient will try topical anti-inflammatories. Once weekly vitamin D given for muscle strength and endurance. Follow-up again in 4 weeks. Worsening symptoms consider physical therapy.   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      Home Medications    Prior to Admission medications   Medication Sig Start Date End Date Taking? Authorizing Provider  predniSONE (DELTASONE) 20 MG tablet Take 2 tablets (40 mg total) by mouth daily with breakfast for 5 days. 08/13/22 08/18/22 Yes Francene Finders, PA-C  amLODipine (NORVASC) 5 MG tablet TAKE 1 TABLET(5 MG) BY MOUTH AT BEDTIME 02/20/22   Nche, Charlene Brooke, NP  aspirin EC 81 MG tablet Take 81 mg by mouth daily. Swallow whole.    [provider]  atorvastatin (LIPITOR) 40 MG tablet Take 1 tablet (40 mg total) by mouth daily. 04/07/22   Nche, Charlene Brooke, NP  lisinopril (ZESTRIL) 5 MG tablet Take 1 tablet (5 mg total) by mouth daily. 07/21/22   Nche, Charlene Brooke, NP  metFORMIN (GLUCOPHAGE-XR) 500 MG 24 hr tablet Take 1 tablet (500 mg total) by mouth daily with breakfast. 02/20/22   Nche, Charlene Brooke, NP  potassium chloride SA (KLOR-CON M) 20 MEQ tablet Take 1 tablet (20 mEq total) by mouth 2 (two) times daily. 07/21/22   Nche, Charlene Brooke, NP    Family History Family History  Problem Relation Age of Onset   Diabetes Mother    Lung cancer Mother    Pancreatic cancer Sister  Other Brother        aids   Colon cancer Neg Hx    Colon polyps Neg Hx     Social History Social History   Tobacco Use   Smoking status: Never   Smokeless tobacco: Never  Vaping Use   Vaping Use: Never used  Substance Use Topics   Alcohol use: No    Alcohol/week: 0.0 standard drinks of alcohol   Drug use: No     Allergies   Patient has no known allergies.   Review of Systems Review of Systems  Constitutional:  Negative for chills and fever.  Eyes:  Negative for discharge and redness.  Respiratory:  Negative for shortness of breath.   Cardiovascular:  Negative for chest pain.   Gastrointestinal:  Negative for nausea and vomiting.  Musculoskeletal:  Positive for arthralgias.  Neurological:  Negative for light-headedness and numbness.     Physical Exam Triage Vital Signs ED Triage Vitals [08/13/22 1551]  Enc Vitals Group     BP (!) 149/84     Pulse Rate 88     Resp 16     Temp 98.3 F (36.8 C)     Temp Source Oral     SpO2 95 %     Weight      Height      Head Circumference      Peak Flow      Pain Score 7     Pain Loc      Pain Edu?      Excl. in [place]?    No data found.  Updated Vital Signs BP (!) 149/84 (BP Location: Left Arm)   Pulse 88   Temp 98.3 F (36.8 C) (Oral)   Resp 16   SpO2 95%      Physical Exam Vitals and nursing note reviewed.  Constitutional:      General: She is not in acute distress.    Appearance: Normal appearance. She is not ill-appearing.  HENT:     Head: Normocephalic and atraumatic.  Eyes:     Conjunctiva/sclera: Conjunctivae normal.  Cardiovascular:     Rate and Rhythm: Normal rate.  Pulmonary:     Effort: Pulmonary effort is normal.  Musculoskeletal:     Comments: TTP noted diffusely to anterior right shoulder anteriorly, laterally, mild decreased ROM due to pain  Neurological:     Mental Status: She is alert.     Comments: Grip strength 5/5 bilaterally  Psychiatric:        Mood and Affect: Mood normal.        Behavior: Behavior normal.        Thought Content: Thought content normal.      UC Treatments / Results  Labs (all labs ordered are listed, but only abnormal results are displayed) Labs Reviewed - No data to display  EKG   Radiology No results found.  Procedures Procedures (including critical care time)  Medications Ordered in UC Medications - No data to display  Initial Impression / Assessment and Plan / UC Course  I have reviewed the triage vital signs and the nursing notes.  Pertinent labs & imaging results that were available during my care of the patient were reviewed by  me and considered in my medical decision making (see chart for details).    Steroid burst prescribed for treatment of suspected musculoskeletal etiology of pain. Discussed same with patient. Low suspicion of cardiac etiology given right sided pain and reproducibility on exam with lack  of other cardiac related symptoms. Encouraged follow up in ED with any worsening symptoms or further concerns.   Final Clinical Impressions(s) / UC Diagnoses   Final diagnoses:  Acute pain of right shoulder   Discharge Instructions   None    ED Prescriptions     Medication Sig Dispense Auth. Provider   predniSONE (DELTASONE) 20 MG tablet Take 2 tablets (40 mg total) by mouth daily with breakfast for 5 days. 10 tablet Francene Finders, PA-C      PDMP not reviewed this encounter.   Francene Finders, PA-C 08/14/22 [phone]

## 2022-08-13 NOTE — ED Triage Notes (Signed)
Pt c/o right shoulder pain x 2 days. States it worsens with different positions. Denies known trauma/injury/lifting.

## 2022-08-14 ENCOUNTER — Encounter: Payer: Self-pay | Admitting: Physician Assistant

## 2022-08-15 NOTE — Progress Notes (Unsigned)
Jill Shah Phone: [phone] Subjective:   [name], am serving as a scribe for Dr. Hulan Saas.  I'm seeing this patient by the request  of:  Nche, Charlene Brooke, NP  CC: Low back pain follow-up  [id number]  Jill Shah is a 61 y.o. female coming in with complaint of hip, back and neck pain. Last seen in 2019. Patient states that she had an increase in L hip pain. Also experiencing more spasms in her back at night. Using aleve. Had methylprednisone and ketorolac injection in May by Dr. Glennon Mac. Last epidural October 2019.    MRI lumbar spine 2019 IMPRESSION: 1. Prior decompression and fusion at L4-L5 with no adverse features.   2. However, progressed and severe spinal degeneration at the adjacent segments: - L3-L4 disc space loss with new subtle anterolisthesis and severe posterior element degeneration. Subsequent new moderate spinal stenosis with right greater than left L4 nerve level lateral recess stenosis and moderate to severe left greater than right L3 foraminal stenosis. - L5-S1 severe disc space loss with bulky circumferential disc extrusion. New severe left S1 lateral recess and bilateral L5 foraminal stenosis with moderate to severe right S1 level stenosis and mild overall spinal stenosis.   3. Chronic retrolisthesis at L2-L3 with mildly progressed disc degeneration, new mild spinal stenosis and mild to moderate right L2 foraminal stenosis.   4. Chronic anterolisthesis at T11-T12 with severe posterior element hypertrophy. Stable mild spinal stenosis but increased chronic T11 foraminal stenosis, worse on the left.  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 Tolerated the procedure well. We discussed icing regimen and home exercises. We discussed which activities to do in which ones to avoid. Patient will try topical anti-inflammatories. Once weekly vitamin D given for muscle strength and endurance. Follow-up again in 4 weeks. Worsening symptoms consider physical therapy.   UTI (urinary tract infection) 12/25/2017   Past Surgical History:  Procedure Laterality Date   CESAREAN SECTION     x2   fibroid ablation     TUBAL LIGATION     Social History   Socioeconomic History   Marital status: Married    Spouse name: Not on file   Number of children: Not on file   Years of education: Not on file   Highest education level: Not on file  Occupational History   Not on file  Tobacco Use   Smoking status: Never   Smokeless tobacco: Never  Vaping Use   Vaping Use: Never used  Substance and Sexual Activity   Alcohol use: No    Alcohol/week: 0.0 standard drinks of alcohol   Drug use: No   Sexual activity: Not on file  Other Topics Concern   Not on file  Social History Narrative   Not on file   Social Determinants of Health   Financial Resource Strain: Not on file  Food Insecurity: Not on file  Transportation Needs: Not on file  Physical Activity: Not on file  Stress: Not on file  Social Connections: Not on file   No Known Allergies Family History  Problem Relation Age of Onset   Diabetes Mother    Lung cancer Mother    Pancreatic cancer Sister    Other Brother        aids   Colon cancer Neg Hx    Colon polyps Neg Hx     Current Outpatient Medications (Endocrine & Metabolic):    metFORMIN (GLUCOPHAGE-XR) 500 MG 24 hr tablet, Take 1 tablet (500 mg total) by mouth daily with  breakfast.  Current Outpatient Medications (Cardiovascular):    amLODipine (NORVASC) 5 MG tablet, TAKE 1 TABLET(5 MG) BY MOUTH AT BEDTIME   atorvastatin (LIPITOR) 40 MG tablet, Take 1 tablet (40 mg total) by mouth daily.   lisinopril (ZESTRIL) 5 MG tablet, Take 1 tablet (5 mg total) by mouth daily.   Current Outpatient Medications (Analgesics):    aspirin EC 81 MG tablet, Take 81 mg by mouth daily. Swallow whole.   Current Outpatient Medications (Other):    gabapentin (NEURONTIN) 100 MG capsule, Take 2 capsules (200 mg total) by mouth at bedtime.   potassium chloride SA (KLOR-CON M) 20 MEQ tablet, Take 1 tablet (20 mEq total) by mouth 2 (two) times daily.   Reviewed prior external information including notes and imaging from  primary care provider did review patient's most recent labs showing the patient continues to have an A1c elevated at 6.7 continue elevation of cholesterol As well as notes that were available from care everywhere and other healthcare systems.  Past medical history, social, surgical and family history all reviewed in electronic medical record.  No pertanent information unless stated regarding to the chief complaint.   Review of Systems:  No headache, visual changes, nausea, vomiting, diarrhea, constipation, dizziness, abdominal pain, skin rash, fevers, chills, night sweats, weight loss, swollen lymph nodes, , joint swelling, chest pain, shortness of breath, mood changes. POSITIVE muscle aches, body aches  Objective  Blood pressure 124/76, pulse 73, height '4\' 11"'$  (1.499 m), weight 191 lb (86.6 kg), SpO2 99 %.   General: No apparent distress alert and oriented x3 mood and affect normal, dressed appropriately.  HEENT: Pupils equal, extraocular movements intact  Respiratory: Patient's speak in full sentences and does not appear short of breath  Cardiovascular: No lower extremity edema, non tender, no erythema  Low back exam does have some loss of lordosis.  Positive  straight leg test with involuntary guarding noted on the left leg had 10 to 15 degrees of forward flexion.  Patient has 4-5 strength of dorsiflexion of the feet bilaterally.  Does have 3+ DTRs of the Achilles and patella compared to the contralateral side.  No beats of clonus no noted.    Impression and Recommendations:     The above documentation has been reviewed and is accurate and complete Lyndal Pulley, DO

## 2022-08-19 ENCOUNTER — Ambulatory Visit (INDEPENDENT_AMBULATORY_CARE_PROVIDER_SITE_OTHER): Payer: 59 | Admitting: Family Medicine

## 2022-08-19 ENCOUNTER — Encounter: Payer: Self-pay | Admitting: Family Medicine

## 2022-08-19 ENCOUNTER — Ambulatory Visit (INDEPENDENT_AMBULATORY_CARE_PROVIDER_SITE_OTHER): Payer: 59

## 2022-08-19 VITALS — BP 124/76 | HR 73 | Ht 59.0 in | Wt 191.0 lb

## 2022-08-19 DIAGNOSIS — M5416 Radiculopathy, lumbar region: Secondary | ICD-10-CM

## 2022-08-19 DIAGNOSIS — M545 Low back pain, unspecified: Secondary | ICD-10-CM | POA: Diagnosis not present

## 2022-08-19 MED ORDER — KETOROLAC TROMETHAMINE 60 MG/2ML IM SOLN
60.0000 mg | Freq: Once | INTRAMUSCULAR | Status: AC
  Administered 2022-08-19: 60 mg via INTRAMUSCULAR

## 2022-08-19 MED ORDER — GABAPENTIN 100 MG PO CAPS: 200.0000 mg | ORAL_CAPSULE | Freq: Every day | ORAL | 0 refills | Status: DC

## 2022-08-19 MED ORDER — METHYLPREDNISOLONE ACETATE 80 MG/ML IJ SUSP
80.0000 mg | Freq: Once | INTRAMUSCULAR | Status: AC
  Administered 2022-08-19: 80 mg via INTRAMUSCULAR

## 2022-08-19 NOTE — Patient Instructions (Signed)
Gabapentin '200mg'$  at night Exercises Xray on way out See me again in 6-8 weeks

## 2022-08-19 NOTE — Assessment & Plan Note (Addendum)
Significant positive radiculopathies especially on the left side at the moment.  Patient does have a positive straight leg test at 15 degrees of forward flexion.  Deep tendon reflexes do show that patient actually has hypertonicity noted.  Seems to be 3+ on the left compared to 2+ on the right.  I am very concerned that patient has severe spinal stenosis that was noted previously at the L5-S1 area with a severe left S1 nerve impingement is likely more severe at the moment.  We discussed with patient that we may need advanced imaging again with this MRI being greater than 36 years old.  Likely no significant atrophy of the musculature.  Discussed gabapentin which patient will start again.  Toradol and Depo-Medrol given today.  Increase activity slowly.  Follow-up again in 3-4weeks.

## 2022-08-25 ENCOUNTER — Telehealth: Payer: Self-pay | Admitting: Family Medicine

## 2022-08-25 NOTE — Telephone Encounter (Signed)
Patient called stating that the Gabapentin that was prescribed is causing her to have cramps in her legs. She asked if there was something else that she could take for her hip pain?  Please advise.

## 2022-08-27 NOTE — Telephone Encounter (Signed)
Sent patient MyChart message to see if she would like meloxicam.

## 2022-08-28 ENCOUNTER — Other Ambulatory Visit: Payer: Self-pay

## 2022-08-28 MED ORDER — MELOXICAM 7.5 MG PO TABS: 7.5000 mg | ORAL_TABLET | Freq: Every day | ORAL | 0 refills | Status: DC

## 2022-08-28 NOTE — Telephone Encounter (Signed)
Pt called, did not see MyChart message. Is OK with Meloxicam to [facility] on [address].   Also, pt has not had any relief from the injection she received here on 11/21. She said she normally has relief by now and was unsure what next step was.

## 2022-08-28 NOTE — Telephone Encounter (Signed)
Pt called, did not see MyChart message. Is OK with Meloxicam to [facility] on [address].  Also, pt has not had any relief from the injection she received here on 11/21. She said she normally has relief by now and was unsure what next step was.

## 2022-09-01 ENCOUNTER — Ambulatory Visit: Payer: 59 | Admitting: Nurse Practitioner

## 2022-09-11 ENCOUNTER — Ambulatory Visit: Payer: 59 | Admitting: Nurse Practitioner

## 2022-09-11 ENCOUNTER — Encounter: Payer: Self-pay | Admitting: Nurse Practitioner

## 2022-09-11 DIAGNOSIS — I10 Essential (primary) hypertension: Secondary | ICD-10-CM | POA: Diagnosis not present

## 2022-09-11 MED ORDER — AMLODIPINE BESYLATE 10 MG PO TABS: 10.0000 mg | ORAL_TABLET | Freq: Every day | ORAL | 3 refills | Status: DC

## 2022-09-11 MED ORDER — LISINOPRIL 10 MG PO TABS: 10.0000 mg | ORAL_TABLET | Freq: Every day | ORAL | 1 refills | Status: DC

## 2022-09-11 NOTE — Patient Instructions (Signed)
Increase amlodipine to '10mg'$  in PM and lisinopril to '10mg'$  in AM Maintain DASH diet and daily exercise

## 2022-09-11 NOTE — Assessment & Plan Note (Signed)
BP not at goal with amlodipine and lisinopril. Home BP 160s/90s BP Readings from Last 3 Encounters:  09/11/22 (!) 140/80  08/19/22 124/76  08/13/22 (!) 149/84    Increase lisinopril to '10mg'$  in AM and amlodipine to '10mg'$  in PM Advised about imporatnce of DASH diet and daily exercise. F/up in 29month

## 2022-09-11 NOTE — Progress Notes (Signed)
Established Patient Visit  Patient: Jill Shah   DOB: 07-Apr-1961   61 y.o. Female  MRN: [id number] Visit Date: 09/11/2022  Subjective:    Chief Complaint  Patient presents with  . Office Visit    HTN  Checks BP daily, but hasn't checked it in a week  No concerns    HPI HYPERTENSION, BENIGN ESSENTIAL BP not at goal with amlodipine and lisinopril. Home BP 160s/90s BP Readings from Last 3 Encounters:  09/11/22 (!) 140/80  08/19/22 124/76  08/13/22 (!) 149/84    Increase lisinopril to '10mg'$  in AM and amlodipine to '10mg'$  in PM Advised about imporatnce of DASH diet and daily exercise. F/up in 18month Reviewed medical, surgical, and social history today  Medications: Outpatient Medications Prior to Visit  Medication Sig  . aspirin EC 81 MG tablet Take 81 mg by mouth daily. Swallow whole.  .Marland Kitchenatorvastatin (LIPITOR) 40 MG tablet Take 1 tablet (40 mg total) by mouth daily.  . meloxicam (MOBIC) 7.5 MG tablet Take 1 tablet (7.5 mg total) by mouth daily.  . metFORMIN (GLUCOPHAGE-XR) 500 MG 24 hr tablet Take 1 tablet (500 mg total) by mouth daily with breakfast.  . potassium chloride SA (KLOR-CON M) 20 MEQ tablet Take 1 tablet (20 mEq total) by mouth 2 (two) times daily.  . [DISCONTINUED] amLODipine (NORVASC) 5 MG tablet TAKE 1 TABLET(5 MG) BY MOUTH AT BEDTIME  . [DISCONTINUED] lisinopril (ZESTRIL) 5 MG tablet Take 1 tablet (5 mg total) by mouth daily.  . [DISCONTINUED] gabapentin (NEURONTIN) 100 MG capsule Take 2 capsules (200 mg total) by mouth at bedtime. (Patient not taking: Reported on 09/11/2022)   No facility-administered medications prior to visit.   Reviewed past medical and social history.   ROS per HPI above      Objective:  BP (!) 140/80   Pulse 80   Temp (!) 95.4 F (35.2 C) (Temporal)   Ht '4\' 11"'$  (1.499 m)   Wt 195 lb 6.4 oz (88.6 kg)   SpO2 96%   BMI 39.47 kg/m      Physical Exam Cardiovascular:     Rate and Rhythm: Normal  rate.     Pulses: Normal pulses.  Pulmonary:     Effort: Pulmonary effort is normal.  Musculoskeletal:     Right lower leg: No edema.     Left lower leg: No edema.  Neurological:     Mental Status: She is alert and oriented to person, place, and time.    No results found for any visits on 09/11/22.    Assessment & Plan:    Problem List Items Addressed This Visit       Cardiovascular and Mediastinum   HYPERTENSION, BENIGN ESSENTIAL    BP not at goal with amlodipine and lisinopril. Home BP 160s/90s BP Readings from Last 3 Encounters:  09/11/22 (!) 140/80  08/19/22 124/76  08/13/22 (!) 149/84    Increase lisinopril to '10mg'$  in AM and amlodipine to '10mg'$  in PM Advised about imporatnce of DASH diet and daily exercise. F/up in 150month    Relevant Medications   amLODipine (NORVASC) 10 MG tablet   lisinopril (ZESTRIL) 10 MG tablet     Endocrine   DM (diabetes mellitus) ([place]  Relevant Medications   lisinopril (ZESTRIL) 10 MG tablet   Return in about 4 weeks (around 10/09/2022) for HTN.     ChWilfred LacyNP

## 2022-09-24 ENCOUNTER — Other Ambulatory Visit: Payer: Self-pay | Admitting: Family Medicine

## 2022-09-24 NOTE — Progress Notes (Deleted)
Jill Shah Phone: [phone] Subjective:    I'm seeing this patient by the request  of:  Nche, Charlene Brooke, NP  CC:   [id number]  08/19/2022 Significant positive radiculopathies especially on the left side at the moment.  Patient does have a positive straight leg test at 15 degrees of forward flexion.  Deep tendon reflexes do show that patient actually has hypertonicity noted.  Seems to be 3+ on the left compared to 2+ on the right.  I am very concerned that patient has severe spinal stenosis that was noted previously at the L5-S1 area with a severe left S1 nerve impingement is likely more severe at the moment.  We discussed with patient that we may need advanced imaging again with this MRI being greater than 8 years old.  Likely no significant atrophy of the musculature.  Discussed gabapentin which patient will start again.  Toradol and Depo-Medrol given today.  Increase activity slowly.  Follow-up again in 3-4weeks.     Update 09/30/2022 Jill Shah is a 61 y.o. female coming in with complaint of lumbar spine pain. 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 Tolerated the procedure well. We discussed icing regimen and home exercises. We discussed which activities to do in which ones to avoid. Patient will try topical anti-inflammatories. Once weekly vitamin D given for muscle strength and endurance. Follow-up again in 4 weeks. Worsening  symptoms consider physical therapy.   UTI (urinary tract infection) 12/25/2017   Past Surgical History:  Procedure Laterality Date   CESAREAN SECTION     x2   fibroid ablation     TUBAL LIGATION     Social History   Socioeconomic History   Marital status: Married    Spouse name: Not on file   Number of children: Not on file   Years of education: Not on file   Highest education level: Not on file  Occupational History   Not on file  Tobacco Use   Smoking status: Never   Smokeless tobacco: Never  Vaping Use   Vaping Use: Never used  Substance and Sexual Activity   Alcohol use: No    Alcohol/week: 0.0 standard drinks of alcohol   Drug use: No   Sexual activity: Not on file  Other Topics Concern   Not on file  Social History Narrative   Not on file   Social Determinants of Health   Financial Resource Strain: Not on file  Food Insecurity: Not on file  Transportation Needs: Not on file  Physical Activity: Not on file  Stress: Not on file  Social Connections: Not on file   No Known Allergies Family History  Problem Relation Age of Onset   Diabetes Mother    Lung cancer Mother    Pancreatic cancer Sister    Other Brother        aids   Colon cancer Neg Hx    Colon polyps  Neg Hx     Current Outpatient Medications (Endocrine & Metabolic):    metFORMIN (GLUCOPHAGE-XR) 500 MG 24 hr tablet, Take 1 tablet (500 mg total) by mouth daily with breakfast.  Current Outpatient Medications (Cardiovascular):    amLODipine (NORVASC) 10 MG tablet, Take 1 tablet (10 mg total) by mouth daily.   atorvastatin (LIPITOR) 40 MG tablet, Take 1 tablet (40 mg total) by mouth daily.   lisinopril (ZESTRIL) 10 MG tablet, Take 1 tablet (10 mg total) by mouth daily.   Current Outpatient Medications (Analgesics):    aspirin EC 81 MG tablet, Take 81 mg by mouth daily. Swallow whole.   meloxicam (MOBIC) 7.5 MG tablet, Take 1 tablet (7.5 mg total) by mouth daily.   Current Outpatient  Medications (Other):    potassium chloride SA (KLOR-CON M) 20 MEQ tablet, Take 1 tablet (20 mEq total) by mouth 2 (two) times daily.   Reviewed prior external information including notes and imaging from  primary care provider As well as notes that were available from care everywhere and other healthcare systems.  Past medical history, social, surgical and family history all reviewed in electronic medical record.  No pertanent information unless stated regarding to the chief complaint.   Review of Systems:  No headache, visual changes, nausea, vomiting, diarrhea, constipation, dizziness, abdominal pain, skin rash, fevers, chills, night sweats, weight loss, swollen lymph nodes, body aches, joint swelling, chest pain, shortness of breath, mood changes. POSITIVE muscle aches  Objective  There were no vitals taken for this visit.   General: No apparent distress alert and oriented x3 mood and affect normal, dressed appropriately.  HEENT: Pupils equal, extraocular movements intact  Respiratory: Patient's speak in full sentences and does not appear short of breath  Cardiovascular: No lower extremity edema, non tender, no erythema      Impression and Recommendations:

## 2022-09-30 ENCOUNTER — Ambulatory Visit: Payer: 59 | Admitting: Family Medicine

## 2022-10-09 ENCOUNTER — Ambulatory Visit: Payer: 59 | Admitting: Nurse Practitioner

## 2022-10-27 ENCOUNTER — Ambulatory Visit: Payer: 59 | Admitting: Family Medicine

## 2022-10-31 ENCOUNTER — Ambulatory Visit: Payer: 59 | Admitting: Nurse Practitioner

## 2022-11-18 NOTE — Progress Notes (Deleted)
[place] [facility] Phone: [phone] Subjective:    I'm seeing this patient by the request  of:  Nche, Charlene Brooke, NP  CC:   [id number]  08/19/2022 Significant positive radiculopathies especially on the left side at the moment.  Patient does have a positive straight leg test at 15 degrees of forward flexion.  Deep tendon reflexes do show that patient actually has hypertonicity noted.  Seems to be 3+ on the left compared to 2+ on the right.  I am very concerned that patient has severe spinal stenosis that was noted previously at the L5-S1 area with a severe left S1 nerve impingement is likely more severe at the moment.  We discussed with patient that we may need advanced imaging again with this MRI being greater than 39 years old.  Likely no significant atrophy of the musculature.  Discussed gabapentin which patient will start again.  Toradol and Depo-Medrol given today.  Increase activity slowly.  Follow-up again in 3-4weeks.     Update 11/19/2022 Anandi Sirna Scudder-Francis is a 62 y.o. female coming in with complaint of lumbar spine pain. 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 Tolerated the procedure well. We discussed icing regimen and home exercises. We discussed which activities to do in which ones to avoid. Patient will try topical  anti-inflammatories. Once weekly vitamin D given for muscle strength and endurance. Follow-up again in 4 weeks. Worsening symptoms consider physical therapy.   UTI (urinary tract infection) 12/25/2017   Past Surgical History:  Procedure Laterality Date   CESAREAN SECTION     x2   fibroid ablation     TUBAL LIGATION     Social History   Socioeconomic History   Marital status: Married    Spouse name: Not on file   Number of children: Not on file   Years of education: Not on file   Highest education level: Not on file  Occupational History   Not on file  Tobacco Use   Smoking status: Never   Smokeless tobacco: Never  Vaping Use   Vaping Use: Never used  Substance and Sexual Activity   Alcohol use: No    Alcohol/week: 0.0 standard drinks of alcohol   Drug use: No   Sexual activity: Not on file  Other Topics Concern   Not on file  Social History Narrative   Not on file   Social Determinants of Health   Financial Resource Strain: Not on file  Food Insecurity: Not on file  Transportation Needs: Not on file  Physical Activity: Not on file  Stress: Not on file  Social Connections: Not on file   No Known Allergies Family History  Problem Relation Age of Onset   Diabetes Mother    Lung cancer Mother    Pancreatic cancer Sister    Other Brother  aids   Colon cancer Neg Hx    Colon polyps Neg Hx     Current Outpatient Medications (Endocrine & Metabolic):    metFORMIN (GLUCOPHAGE-XR) 500 MG 24 hr tablet, Take 1 tablet (500 mg total) by mouth daily with breakfast.  Current Outpatient Medications (Cardiovascular):    amLODipine (NORVASC) 10 MG tablet, Take 1 tablet (10 mg total) by mouth daily.   atorvastatin (LIPITOR) 40 MG tablet, Take 1 tablet (40 mg total) by mouth daily.   lisinopril (ZESTRIL) 10 MG tablet, Take 1 tablet (10 mg total) by mouth daily.   Current Outpatient Medications (Analgesics):    aspirin EC 81 MG tablet, Take 81 mg by mouth daily.  Swallow whole.   meloxicam (MOBIC) 7.5 MG tablet, TAKE 1 TABLET(7.5 MG) BY MOUTH DAILY   Current Outpatient Medications (Other):    potassium chloride SA (KLOR-CON M) 20 MEQ tablet, Take 1 tablet (20 mEq total) by mouth 2 (two) times daily.   Reviewed prior external information including notes and imaging from  primary care provider As well as notes that were available from care everywhere and other healthcare systems.  Past medical history, social, surgical and family history all reviewed in electronic medical record.  No pertanent information unless stated regarding to the chief complaint.   Review of Systems:  No headache, visual changes, nausea, vomiting, diarrhea, constipation, dizziness, abdominal pain, skin rash, fevers, chills, night sweats, weight loss, swollen lymph nodes, body aches, joint swelling, chest pain, shortness of breath, mood changes. POSITIVE muscle aches  Objective  There were no vitals taken for this visit.   General: No apparent distress alert and oriented x3 mood and affect normal, dressed appropriately.  HEENT: Pupils equal, extraocular movements intact  Respiratory: Patient's speak in full sentences and does not appear short of breath  Cardiovascular: No lower extremity edema, non tender, no erythema      Impression and Recommendations:

## 2022-11-19 ENCOUNTER — Ambulatory Visit: Payer: 59 | Admitting: Family Medicine

## 2022-12-02 ENCOUNTER — Other Ambulatory Visit: Payer: Self-pay | Admitting: Family Medicine

## 2022-12-03 ENCOUNTER — Encounter: Payer: Self-pay | Admitting: Nurse Practitioner

## 2022-12-03 ENCOUNTER — Ambulatory Visit: Payer: 59 | Admitting: Nurse Practitioner

## 2022-12-03 VITALS — BP 140/84 | HR 96 | Temp 100.4°F | Resp 16 | Ht 59.0 in | Wt 195.0 lb

## 2022-12-03 DIAGNOSIS — E1169 Type 2 diabetes mellitus with other specified complication: Secondary | ICD-10-CM | POA: Diagnosis not present

## 2022-12-03 DIAGNOSIS — J101 Influenza due to other identified influenza virus with other respiratory manifestations: Secondary | ICD-10-CM

## 2022-12-03 DIAGNOSIS — I10 Essential (primary) hypertension: Secondary | ICD-10-CM

## 2022-12-03 DIAGNOSIS — E785 Hyperlipidemia, unspecified: Secondary | ICD-10-CM | POA: Diagnosis not present

## 2022-12-03 LAB — POCT INFLUENZA A/B
Influenza A, POC: POSITIVE — AB
Influenza B, POC: NEGATIVE

## 2022-12-03 LAB — POC COVID19 BINAXNOW: SARS Coronavirus 2 Ag: NEGATIVE

## 2022-12-03 MED ORDER — LISINOPRIL 20 MG PO TABS: 20.0000 mg | ORAL_TABLET | Freq: Every day | ORAL | 1 refills | Status: DC

## 2022-12-03 MED ORDER — BENZONATATE 100 MG PO CAPS: 100.0000 mg | ORAL_CAPSULE | Freq: Three times a day (TID) | ORAL | 0 refills | Status: DC | PRN

## 2022-12-03 MED ORDER — ALBUTEROL SULFATE HFA 108 (90 BASE) MCG/ACT IN AERS: 1.0000 | INHALATION_SPRAY | Freq: Four times a day (QID) | RESPIRATORY_TRACT | 0 refills | Status: AC | PRN

## 2022-12-03 MED ORDER — OSELTAMIVIR PHOSPHATE 75 MG PO CAPS: 75.0000 mg | ORAL_CAPSULE | Freq: Two times a day (BID) | ORAL | 0 refills | Status: DC

## 2022-12-03 NOTE — Patient Instructions (Addendum)
Increase lisinopril to '20mg'$  daily Maintain other med doses Start tamiflu  Schedule fasting lab appt in 1week. Need to be fasting 8hrs prior to blood draw. Ok to drink water and take BP meds. Encourage adequate oral hydration. Avoid decongestants if you have high blood pressure. Use coricidin or mucinex DM or Robitussin  or delsym for cough.  You can use plain "Tylenol" or "Advil" for fever, chills and achyness. Use cool mist humidifier at bedtime to help with nasal congestion and cough.   "Common cold" symptoms are usually triggered by a virus.  The antibiotics are usually not necessary. On average, a" viral cold" illness may take 7-10 days to resolve. Please, make an appointment if you are not better or if you're worse.   "Common cold" symptoms are usually triggered by a virus.  The antibiotics are usually not necessary. On average, a" viral cold" illness may take 7-10 days to resolve. Please, make an appointment if you are not better or if you're worse.

## 2022-12-03 NOTE — Assessment & Plan Note (Signed)
BP not at goal with amlodipine and lisinopril BP Readings from Last 3 Encounters:  12/03/22 (!) 140/84  09/11/22 (!) 140/80  08/19/22 124/76    Maintain amlodipine dose Increase lisinopril to '20mg'$  daily Repeat BMP F/up in 73month

## 2022-12-03 NOTE — Assessment & Plan Note (Signed)
No adverse effects with atorvastatin Repeat lipid panel

## 2022-12-03 NOTE — Progress Notes (Signed)
Established Patient Visit  Patient: Jill Shah   DOB: Jun 21, 1961   62 y.o. Female  MRN: [id number] Visit Date: 12/03/2022  Subjective:    Chief Complaint  Patient presents with   Hypertension   URI    Samuel [place] - started Sunday    URI  This is a new problem. The current episode started in the past 7 days. The problem has been unchanged. The maximum temperature recorded prior to her arrival was 100.4 - 100.9 F. The fever has been present for 1 to 2 days. Associated symptoms include congestion, coughing, headaches, joint pain, rhinorrhea, sinus pain, sneezing and wheezing. Pertinent negatives include no abdominal pain, chest pain, diarrhea, dysuria, ear pain, joint swelling, nausea, neck pain, plugged ear sensation, rash, sore throat, swollen glands or vomiting. She has tried decongestant and acetaminophen for the symptoms. The treatment provided no relief.   No problem-specific Assessment & Plan notes found for this encounter.  Reviewed medical, surgical, and social history today  Medications: Outpatient Medications Prior to Visit  Medication Sig   amLODipine (NORVASC) 10 MG tablet Take 1 tablet (10 mg total) by mouth daily.   aspirin EC 81 MG tablet Take 81 mg by mouth daily. Swallow whole.   atorvastatin (LIPITOR) 40 MG tablet Take 1 tablet (40 mg total) by mouth daily.   meloxicam (MOBIC) 7.5 MG tablet TAKE 1 TABLET(7.5 MG) BY MOUTH DAILY   metFORMIN (GLUCOPHAGE-XR) 500 MG 24 hr tablet Take 1 tablet (500 mg total) by mouth daily with breakfast.   potassium chloride SA (KLOR-CON M) 20 MEQ tablet Take 1 tablet (20 mEq total) by mouth 2 (two) times daily.   [DISCONTINUED] lisinopril (ZESTRIL) 10 MG tablet Take 1 tablet (10 mg total) by mouth daily.   No facility-administered medications prior to visit.   Reviewed past medical and social history.   ROS per HPI above      Objective:  BP (!) 140/84 (BP Location: Left Arm, Patient Position: Sitting, Cuff Size:  Normal)   Pulse 96   Temp (!) 100.4 F (38 C) (Oral)   Resp 16   Ht '4\' 11"'$  (1.499 m)   Wt 195 lb (88.5 kg)   SpO2 96%   BMI 39.39 kg/m      Physical Exam  Results for orders placed or performed in visit on 12/03/22  POCT Influenza A/B  Result Value Ref Range   Influenza A, POC Positive (A) Negative   Influenza B, POC Negative Negative  POC COVID-19  Result Value Ref Range   SARS Coronavirus 2 Ag Negative Negative      Assessment & Plan:    Problem List Items Addressed This Visit       Cardiovascular and Mediastinum   HYPERTENSION, BENIGN ESSENTIAL   Relevant Medications   lisinopril (ZESTRIL) 20 MG tablet   Other Relevant Orders   Basic metabolic panel     Endocrine   DM (diabetes mellitus) (HCC)   Relevant Medications   lisinopril (ZESTRIL) 20 MG tablet   Other Relevant Orders   Basic metabolic panel   Hemoglobin A1c   Hyperlipidemia associated with type 2 diabetes mellitus (HCC)   Relevant Medications   lisinopril (ZESTRIL) 20 MG tablet   Other Relevant Orders   Basic metabolic panel   Direct LDL   Other Visit Diagnoses     Influenza A    -  Primary   Relevant Medications  oseltamivir (TAMIFLU) 75 MG capsule   benzonatate (TESSALON) 100 MG capsule   albuterol (VENTOLIN HFA) 108 (90 Base) MCG/ACT inhaler   Other Relevant Orders   POCT Influenza A/B (Completed)   POC COVID-19 (Completed)      Return in about 4 weeks (around 12/31/2022) for HTN.     Wilfred Lacy, NP

## 2022-12-03 NOTE — Assessment & Plan Note (Addendum)
No glucose check at home No adverse effect with metformin Advised to schedule appt with opthalmology Repeat HgA1c, UACR and bmp

## 2022-12-10 ENCOUNTER — Other Ambulatory Visit (INDEPENDENT_AMBULATORY_CARE_PROVIDER_SITE_OTHER): Payer: 59

## 2022-12-10 DIAGNOSIS — E1165 Type 2 diabetes mellitus with hyperglycemia: Secondary | ICD-10-CM

## 2022-12-10 DIAGNOSIS — E1169 Type 2 diabetes mellitus with other specified complication: Secondary | ICD-10-CM

## 2022-12-10 DIAGNOSIS — I10 Essential (primary) hypertension: Secondary | ICD-10-CM

## 2022-12-10 DIAGNOSIS — E785 Hyperlipidemia, unspecified: Secondary | ICD-10-CM | POA: Diagnosis not present

## 2022-12-10 LAB — BASIC METABOLIC PANEL
BUN: 13 mg/dL (ref 6–23)
CO2: 30 mEq/L (ref 19–32)
Calcium: 8.7 mg/dL (ref 8.4–10.5)
Chloride: 104 mEq/L (ref 96–112)
Creatinine, Ser: 0.81 mg/dL (ref 0.40–1.20)
GFR: 78.68 mL/min (ref >60.00)
Glucose, Bld: 114 mg/dL — ABNORMAL HIGH (ref 70–99)
Potassium: 3.8 mEq/L (ref 3.5–5.1)
Sodium: 141 mEq/L (ref 135–145)

## 2022-12-10 LAB — HEMOGLOBIN A1C: Hgb A1c MFr Bld: 6.7 % — ABNORMAL HIGH (ref 4.6–6.5)

## 2022-12-10 LAB — LDL CHOLESTEROL, DIRECT: Direct LDL: 198 mg/dL

## 2022-12-10 NOTE — Progress Notes (Signed)
Pt is here for lans

## 2022-12-11 MED ORDER — ROSUVASTATIN CALCIUM 20 MG PO TABS: 20.0000 mg | ORAL_TABLET | Freq: Every day | ORAL | 3 refills | Status: DC

## 2022-12-11 MED ORDER — METFORMIN HCL ER 500 MG PO TB24: 500.0000 mg | ORAL_TABLET | Freq: Two times a day (BID) | ORAL | 1 refills | Status: DC

## 2022-12-11 NOTE — Addendum Note (Signed)
Addended by: Wilfred Lacy L on: 12/11/2022 09:02 AM   Modules accepted: Orders

## 2022-12-11 NOTE — Progress Notes (Signed)
Abnormal: No change in HgbA1c. Increase metformin dose to '500mg'$  BID. LDL is not at goal, therefore you are at risk for artherosclerosis which can lead to heart disease, CVA and PAD. Change lipitor to crestor New prescriptions sent F/up with me in 50month

## 2022-12-31 ENCOUNTER — Ambulatory Visit: Payer: 59 | Admitting: Nurse Practitioner

## 2023-01-02 ENCOUNTER — Ambulatory Visit: Payer: 59 | Admitting: Family Medicine

## 2023-01-12 ENCOUNTER — Ambulatory Visit (INDEPENDENT_AMBULATORY_CARE_PROVIDER_SITE_OTHER): Payer: 59 | Admitting: Nurse Practitioner

## 2023-01-12 ENCOUNTER — Encounter: Payer: Self-pay | Admitting: Nurse Practitioner

## 2023-01-12 VITALS — BP 170/100 | HR 72 | Resp 16 | Ht 59.0 in | Wt 195.6 lb

## 2023-01-12 DIAGNOSIS — R058 Other specified cough: Secondary | ICD-10-CM

## 2023-01-12 DIAGNOSIS — E1169 Type 2 diabetes mellitus with other specified complication: Secondary | ICD-10-CM

## 2023-01-12 DIAGNOSIS — T464X5A Adverse effect of angiotensin-converting-enzyme inhibitors, initial encounter: Secondary | ICD-10-CM | POA: Diagnosis not present

## 2023-01-12 DIAGNOSIS — E785 Hyperlipidemia, unspecified: Secondary | ICD-10-CM

## 2023-01-12 DIAGNOSIS — I10 Essential (primary) hypertension: Secondary | ICD-10-CM | POA: Diagnosis not present

## 2023-01-12 MED ORDER — BENZONATATE 100 MG PO CAPS: 100.0000 mg | ORAL_CAPSULE | Freq: Three times a day (TID) | ORAL | 0 refills | Status: DC | PRN

## 2023-01-12 MED ORDER — LOSARTAN POTASSIUM-HCTZ 100-12.5 MG PO TABS: 1.0000 | ORAL_TABLET | Freq: Every day | ORAL | 5 refills | Status: DC

## 2023-01-12 NOTE — Progress Notes (Signed)
Established Patient Visit  Patient: Jill Shah   DOB: 1961-03-22   62 y.o. Female  MRN: [id number] Visit Date: 01/12/2023  Subjective:    Chief Complaint  Patient presents with   Cough    Dry hacking cough going on 3 months    HPI HYPERTENSION, BENIGN ESSENTIAL Reports hacking no reproductive cough x 76month, no CP or SOB or fever or tobacco smoke exposure or GERD symptoms or Post nasal drip or wheezing. No Hx of asthma BP Readings from Last 3 Encounters:  01/12/23 (!) 160/84  12/03/22 (!) 140/84  09/11/22 (!) 140/80    Wt Readings from Last 3 Encounters:  01/12/23 195 lb 9.6 oz (88.7 kg)  12/03/22 195 lb (88.5 kg)  09/11/22 195 lb 6.4 oz (88.6 kg)    Stop lisinopril Start losartan/hctz 100/12.5mg  Maintain amlodipine dose F/up in 43month  Hyperlipidemia associated with type 2 diabetes mellitus (HCC) Lipitor  switched to crestor  76months ago due to LDL at 198. Today she reports malaise and dizziness after 2doses of crestor, so med was discontinued.  Advised to try Crestor  EOD. Resume atorvastatin  if unable to tolerate crestor at reduced dose  Reviewed medical, surgical, and social history today  Medications: Outpatient Medications Prior to Visit  Medication Sig   albuterol (VENTOLIN HFA) 108 (90 Base) MCG/ACT inhaler Inhale 1-2 puffs into the lungs every 6 (six) hours as needed for wheezing or shortness of breath.   amLODipine (NORVASC) 10 MG tablet Take 1 tablet (10 mg total) by mouth daily.   aspirin EC 81 MG tablet Take 81 mg by mouth daily. Swallow whole.   meloxicam (MOBIC) 7.5 MG tablet TAKE 1 TABLET(7.5 MG) BY MOUTH DAILY   metFORMIN (GLUCOPHAGE-XR) 500 MG 24 hr tablet Take 1 tablet (500 mg total) by mouth 2 (two) times daily with a meal.   potassium chloride SA (KLOR-CON M) 20 MEQ tablet Take 1 tablet (20 mEq total) by mouth 2 (two) times daily.   rosuvastatin (CRESTOR) 20 MG tablet Take 0.5 tablets (10 mg total) by  mouth 3 (three) times a week.   [DISCONTINUED] benzonatate (TESSALON) 100 MG capsule Take 1-2 capsules (100-200 mg total) by mouth 3 (three) times daily as needed.   [DISCONTINUED] lisinopril (ZESTRIL) 20 MG tablet Take 1 tablet (20 mg total) by mouth daily.   [DISCONTINUED] oseltamivir (TAMIFLU) 75 MG capsule Take 1 capsule (75 mg total) by mouth 2 (two) times daily. (Patient not taking: Reported on 01/12/2023)   [DISCONTINUED] rosuvastatin (CRESTOR) 20 MG tablet Take 1 tablet (20 mg total) by mouth daily.   No facility-administered medications prior to visit.   Reviewed past medical and social history.   ROS per HPI above      Objective:  BP (!) 170/100   Pulse 72   Resp 16   Ht  (1.499 m)   Wt 195 lb 9.6 oz (88.7 kg)   SpO2 99%   BMI 39.51 kg/m      Physical Exam Cardiovascular:     Rate and Rhythm: Normal rate and regular rhythm.     Pulses: Normal pulses.     Heart sounds: Normal heart sounds.  Pulmonary:     Effort: Pulmonary effort is normal.     Breath sounds: Normal breath sounds.  Musculoskeletal:     Right lower leg: No edema.     Left lower leg: No edema.  Neurological:  Mental Status: She is alert and oriented to person, place, and time.     No results found for any visits on 01/12/23.    Assessment & Plan:    Problem List Items Addressed This Visit       Cardiovascular and Mediastinum   HYPERTENSION, BENIGN ESSENTIAL    Reports hacking no reproductive cough x 7month, no CP or SOB or fever or tobacco smoke exposure or GERD symptoms or Post nasal drip or wheezing. No Hx of asthma BP Readings from Last 3 Encounters:  01/12/23 (!) 160/84  12/03/22 (!) 140/84  09/11/22 (!) 140/80    Wt Readings from Last 3 Encounters:  01/12/23 195 lb 9.6 oz (88.7 kg)  12/03/22 195 lb (88.5 kg)  09/11/22 195 lb 6.4 oz (88.6 kg)    Stop lisinopril Start losartan/hctz 100/12.5mg  Maintain amlodipine dose F/up in 4month      Relevant Medications    rosuvastatin (CRESTOR) 20 MG tablet   losartan-hydrochlorothiazide (HYZAAR) 100-12.5 MG tablet     Endocrine   Hyperlipidemia associated with type 2 diabetes mellitus    Lipitor 40mg  switched to crestor 20mg  61months ago due to LDL at 198. Today she reports malaise and dizziness after 2doses of crestor, so med was discontinued.  Advised to try Crestor 10mg  EOD. Resume atorvastatin 80mg  if unable to tolerate crestor at reduced dose       Relevant Medications   rosuvastatin (CRESTOR) 20 MG tablet   losartan-hydrochlorothiazide (HYZAAR) 100-12.5 MG tablet   Other Visit Diagnoses     Cough due to ACE inhibitor    -  Primary   Relevant Medications   benzonatate (TESSALON) 100 MG capsule      Return in about 4 weeks (around 02/09/2023) for HTN.     Alysia Penna, NP

## 2023-01-12 NOTE — Patient Instructions (Signed)
Stop lisinopril Start losartan/hctz Use benzonatate for cough Maintain low salt diet  Cooking With Less Salt Cooking with less salt is one way to reduce the amount of sodium you get from food. Sodium is one of the elements that make up salt. It is found naturally in foods and is also added to certain foods. Depending on your condition and overall health, your health care provider or dietitian may recommend that you reduce your sodium intake. Most people should have less than 2,300 milligrams (mg) of sodium each day. If you have high blood pressure (hypertension), you may need to limit your sodium to 1,500 mg each day. Follow the tips below to help reduce your sodium intake. What are tips for eating less sodium? Reading food labels  Check the food label before buying or using packaged ingredients. Always check the label for the serving size and sodium content. Look for products with no more than 140 mg of sodium in one serving. Check the % Daily Value column to see what percent of the daily recommended amount of sodium is provided in one serving of the product. Foods with 5% or less in this column are considered low in sodium. Foods with 20% or higher are considered high in sodium. Do not choose foods with salt as one of the first three ingredients on the ingredients list. If salt is one of the first three ingredients, it usually means the item is high in sodium. Shopping Buy sodium-free or low-sodium products. Look for the following words on food labels: Low-sodium. Sodium-free. Reduced-sodium. No salt added. Unsalted. Always check the sodium content even if foods are labeled as low-sodium or no salt added. Buy fresh foods. Cooking Use herbs, seasonings without salt, and spices as substitutes for salt. Use sodium-free baking soda when baking. Grill, braise, or roast foods to add flavor with less salt. Avoid adding salt to pasta, rice, or hot cereals. Drain and rinse canned vegetables,  beans, and meat before use. Avoid adding salt when cooking sweets and desserts. Cook with low-sodium ingredients. What foods are high in sodium? Vegetables Regular canned vegetables (not low-sodium or reduced-sodium). Sauerkraut, pickled vegetables, and relishes. Olives. [place] fries. Onion rings. Regular canned tomato sauce and paste. Regular tomato and vegetable juice. Frozen vegetables in sauces. Grains Instant hot cereals. Bread stuffing, pancake, and biscuit mixes. Croutons. Seasoned rice or pasta mixes. Noodle soup cups. Boxed or frozen macaroni and cheese. Regular salted crackers. Self-rising flour. Rolls. Bagels. Flour tortillas and wraps. Meats and other proteins Meat or fish that is salted, canned, smoked, cured, spiced, or pickled. This includes bacon, ham, sausages, hot dogs, corned beef, chipped beef, meat loaves, salt pork, jerky, pickled herring, anchovies, regular canned tuna, and sardines. Salted nuts. Dairy Processed cheese and cheese spreads. Cheese curds. Blue cheese. Feta cheese. String cheese. Regular cottage cheese. Buttermilk. Canned milk. The items listed above may not be a complete list of foods high in sodium. Actual amounts of sodium may be different depending on processing. Contact a dietitian for more information. What foods are low in sodium? Fruits Fresh, frozen, or canned fruit with no sauce added. Fruit juice. Vegetables Fresh or frozen vegetables with no sauce added. "No salt added" canned vegetables. "No salt added" tomato sauce and paste. Low-sodium or reduced-sodium tomato and vegetable juice. Grains Noodles, pasta, quinoa, rice. Shredded or puffed wheat or puffed rice. Regular or quick oats (not instant). Low-sodium crackers. Low-sodium bread. Whole-grain bread and whole-grain pasta. Unsalted popcorn. Meats and other proteins Fresh or frozen  whole meats, poultry (not injected with sodium), and fish with no sauce added. Unsalted nuts. Dried peas, beans, and  lentils without added salt. Unsalted canned beans. Eggs. Unsalted nut butters. Low-sodium canned tuna or chicken. Dairy Milk. Soy milk. Yogurt. Low-sodium cheeses, such as Swiss, [address], [place], and [employer]. Sherbet or ice cream (keep to  cup per serving). Cream cheese. Fats and oils Unsalted butter or margarine. Other foods Homemade pudding. Sodium-free baking soda and baking powder. Herbs and spices. Low-sodium seasoning mixes. Beverages Coffee and tea. Carbonated beverages. The items listed above may not be a complete list of foods low in sodium. Actual amounts of sodium may be different depending on processing. Contact a dietitian for more information. What are some salt alternatives when cooking? The following are herbs, seasonings, and spices that can be used instead of salt to flavor your food. Herbs should be fresh or dried. Do not choose packaged mixes. Next to the name of the herb, spice, or seasoning are some examples of foods you can pair it with. Herbs [place] leaves - Soups, meat and vegetable dishes, and spaghetti sauce. Basil - [employer], soups, pasta, and fish dishes. Cilantro - Meat, poultry, and vegetable dishes. Chili powder - Marinades and Mexican dishes. Chives - Salad dressings and potato dishes. Cumin - Mexican dishes, couscous, and meat dishes. Dill - Fish dishes, sauces, and salads. Fennel - Meat and vegetable dishes, breads, and cookies. Garlic (do not use garlic salt) - [place] dishes, meat dishes, salad dressings, and sauces. Marjoram - Soups, potato dishes, and meat dishes. Oregano - Pizza and spaghetti sauce. Parsley - Salads, soups, pasta, and meat dishes. Rosemary - [place] dishes, salad dressings, soups, and red meats.

## 2023-01-12 NOTE — Assessment & Plan Note (Signed)
Reports hacking no reproductive cough x 34month, no CP or SOB or fever or tobacco smoke exposure or GERD symptoms or Post nasal drip or wheezing. No Hx of asthma BP Readings from Last 3 Encounters:  01/12/23 (!) 160/84  12/03/22 (!) 140/84  09/11/22 (!) 140/80    Wt Readings from Last 3 Encounters:  01/12/23 195 lb 9.6 oz (88.7 kg)  12/03/22 195 lb (88.5 kg)  09/11/22 195 lb 6.4 oz (88.6 kg)    Stop lisinopril Start losartan/hctz 100/12.5mg  Maintain amlodipine dose F/up in 50month

## 2023-01-12 NOTE — Assessment & Plan Note (Signed)
Lipitor 40mg  switched to crestor 20mg  14months ago due to LDL at 198. Today she reports malaise and dizziness after 2doses of crestor, so med was discontinued.  Advised to try Crestor 10mg  EOD. Resume atorvastatin 80mg  if unable to tolerate crestor at reduced dose

## 2023-01-20 ENCOUNTER — Ambulatory Visit: Payer: 59 | Admitting: Nurse Practitioner

## 2023-02-02 ENCOUNTER — Ambulatory Visit: Payer: 59 | Admitting: Family Medicine

## 2023-02-11 ENCOUNTER — Ambulatory Visit: Payer: 59 | Admitting: Nurse Practitioner

## 2023-02-18 ENCOUNTER — Ambulatory Visit: Payer: 59 | Admitting: Nurse Practitioner

## 2023-02-24 NOTE — Progress Notes (Deleted)
Jill Shah [address] [place] [address] Phone: [phone] Subjective:    I'm seeing this patient by the request  of:  Nche, Bonna Gains, NP  CC:   [id number]  08/19/2022 Significant positive radiculopathies especially on the left side at the moment.  Patient does have a positive straight leg test at 15 degrees of forward flexion.  Deep tendon reflexes do show that patient actually has hypertonicity noted.  Seems to be 3+ on the left compared to 2+ on the right.  I am very concerned that patient has severe spinal stenosis that was noted previously at the L5-S1 area with a severe left S1 nerve impingement is likely more severe at the moment.  We discussed with patient that we may need advanced imaging again with this MRI being greater than 81 years old.  Likely no significant atrophy of the musculature.  Discussed gabapentin which patient will start again.  Toradol and Depo-Medrol given today.  Increase activity slowly.  Follow-up again in 3-4weeks.     Update 02/25/2023 Jill Shah is a 62 y.o. female coming in with complaint of L sided lumbar radiculopathy. 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 Tolerated the procedure well. We discussed icing regimen and home exercises. We discussed which activities to do in which ones to avoid. Patient will try topical anti-inflammatories. Once weekly vitamin D given for muscle strength and endurance. Follow-up again in 4  weeks. Worsening symptoms consider physical therapy.   UTI (urinary tract infection) 12/25/2017   Past Surgical History:  Procedure Laterality Date   CESAREAN SECTION     x2   fibroid ablation     TUBAL LIGATION     Social History   Socioeconomic History   Marital status: Married    Spouse name: Not on file   Number of children: Not on file   Years of education: Not on file   Highest education level: Not on file  Occupational History   Not on file  Tobacco Use   Smoking status: Never   Smokeless tobacco: Never  Vaping Use   Vaping Use: Never used  Substance and Sexual Activity   Alcohol use: No    Alcohol/week: 0.0 standard drinks of alcohol   Drug use: No   Sexual activity: Not on file  Other Topics Concern   Not on file  Social History Narrative   Not on file   Social Determinants of Health   Financial Resource Strain: Not on file  Food Insecurity: Not on file  Transportation Needs: Not on file  Physical Activity: Not on file  Stress: Not on file  Social Connections: Not on file   Allergies  Allergen Reactions   Lisinopril Cough   Family History  Problem Relation Age of Onset   Diabetes Mother    Lung cancer Mother    Pancreatic cancer Sister    Other Brother        aids  Colon cancer Neg Hx    Colon polyps Neg Hx     Current Outpatient Medications (Endocrine & Metabolic):    metFORMIN (GLUCOPHAGE-XR) 500 MG 24 hr tablet, Take 1 tablet (500 mg total) by mouth 2 (two) times daily with a meal.  Current Outpatient Medications (Cardiovascular):    amLODipine (NORVASC) 10 MG tablet, Take 1 tablet (10 mg total) by mouth daily.   losartan-hydrochlorothiazide (HYZAAR) 100-12.5 MG tablet, Take 1 tablet by mouth daily.   rosuvastatin (CRESTOR) 20 MG tablet, Take 0.5 tablets (10 mg total) by mouth 3 (three) times a week.  Current Outpatient Medications (Respiratory):    albuterol (VENTOLIN HFA) 108 (90 Base) MCG/ACT inhaler, Inhale 1-2 puffs into the lungs  every 6 (six) hours as needed for wheezing or shortness of breath.   benzonatate (TESSALON) 100 MG capsule, Take 1-2 capsules (100-200 mg total) by mouth 3 (three) times daily as needed.  Current Outpatient Medications (Analgesics):    aspirin EC 81 MG tablet, Take 81 mg by mouth daily. Swallow whole.   meloxicam (MOBIC) 7.5 MG tablet, TAKE 1 TABLET(7.5 MG) BY MOUTH DAILY   Current Outpatient Medications (Other):    potassium chloride SA (KLOR-CON M) 20 MEQ tablet, Take 1 tablet (20 mEq total) by mouth 2 (two) times daily.   Reviewed prior external information including notes and imaging from  primary care provider As well as notes that were available from care everywhere and other healthcare systems.  Past medical history, social, surgical and family history all reviewed in electronic medical record.  No pertanent information unless stated regarding to the chief complaint.   Review of Systems:  No headache, visual changes, nausea, vomiting, diarrhea, constipation, dizziness, abdominal pain, skin rash, fevers, chills, night sweats, weight loss, swollen lymph nodes, body aches, joint swelling, chest pain, shortness of breath, mood changes. POSITIVE muscle aches  Objective  There were no vitals taken for this visit.   General: No apparent distress alert and oriented x3 mood and affect normal, dressed appropriately.  HEENT: Pupils equal, extraocular movements intact  Respiratory: Patient's speak in full sentences and does not appear short of breath  Cardiovascular: No lower extremity edema, non tender, no erythema      Impression and Recommendations:

## 2023-02-25 ENCOUNTER — Ambulatory Visit: Payer: 59 | Admitting: Family Medicine

## 2023-08-17 ENCOUNTER — Encounter (HOSPITAL_COMMUNITY): Payer: Self-pay

## 2023-08-17 ENCOUNTER — Emergency Department (HOSPITAL_COMMUNITY): Payer: 59

## 2023-08-17 ENCOUNTER — Emergency Department (HOSPITAL_COMMUNITY)
Admission: EM | Admit: 2023-08-17 | Discharge: 2023-08-18 | Disposition: A | Discharge status: Home / Self Care | Payer: 59 | Attending: Emergency Medicine | Admitting: Emergency Medicine

## 2023-08-17 ENCOUNTER — Other Ambulatory Visit: Payer: Self-pay

## 2023-08-17 DIAGNOSIS — Z79899 Other long term (current) drug therapy: Secondary | ICD-10-CM | POA: Diagnosis not present

## 2023-08-17 DIAGNOSIS — E119 Type 2 diabetes mellitus without complications: Secondary | ICD-10-CM | POA: Insufficient documentation

## 2023-08-17 DIAGNOSIS — I10 Essential (primary) hypertension: Secondary | ICD-10-CM | POA: Diagnosis not present

## 2023-08-17 DIAGNOSIS — M25512 Pain in left shoulder: Secondary | ICD-10-CM | POA: Diagnosis not present

## 2023-08-17 DIAGNOSIS — Z7984 Long term (current) use of oral hypoglycemic drugs: Secondary | ICD-10-CM | POA: Diagnosis not present

## 2023-08-17 DIAGNOSIS — Z7982 Long term (current) use of aspirin: Secondary | ICD-10-CM | POA: Diagnosis not present

## 2023-08-17 DIAGNOSIS — R079 Chest pain, unspecified: Secondary | ICD-10-CM | POA: Insufficient documentation

## 2023-08-17 LAB — CBC
HCT: 36.5 % (ref 36.0–46.0)
Hemoglobin: 11.6 g/dL — ABNORMAL LOW (ref 12.0–15.0)
MCH: 26.4 pg (ref 26.0–34.0)
MCHC: 31.8 g/dL (ref 30.0–36.0)
MCV: 83 fL (ref 80.0–100.0)
Platelets: 271 10*3/uL (ref 150–400)
RBC: 4.4 MIL/uL (ref 3.87–5.11)
RDW: 16.8 % — ABNORMAL HIGH (ref 11.5–15.5)
WBC: 6.9 10*3/uL (ref 4.0–10.5)
nRBC: 0 % (ref 0.0–0.2)

## 2023-08-17 LAB — BASIC METABOLIC PANEL
Anion gap: 9 (ref 5–15)
BUN: 8 mg/dL (ref 8–23)
CO2: 26 mmol/L (ref 22–32)
Calcium: 9.1 mg/dL (ref 8.9–10.3)
Chloride: 104 mmol/L (ref 98–111)
Creatinine, Ser: 0.94 mg/dL (ref 0.44–1.00)
GFR, Estimated: >60 mL/min (ref >60)
Glucose, Bld: 99 mg/dL (ref 70–99)
Potassium: 3.2 mmol/L — ABNORMAL LOW (ref 3.5–5.1)
Sodium: 139 mmol/L (ref 135–145)

## 2023-08-17 LAB — TROPONIN I (HIGH SENSITIVITY): Troponin I (High Sensitivity): 9 ng/L (ref <18)

## 2023-08-17 NOTE — ED Triage Notes (Signed)
Pt reports generalized CP that radiates down L arm x3 weeks. Pt also endorses SHOB.

## 2023-08-18 LAB — TROPONIN I (HIGH SENSITIVITY): Troponin I (High Sensitivity): 9 ng/L (ref <18)

## 2023-08-18 MED ORDER — OXYCODONE-ACETAMINOPHEN 5-325 MG PO TABS
1.0000 | ORAL_TABLET | Freq: Once | ORAL | Status: AC
  Administered 2023-08-18: 1 via ORAL
  Filled 2023-08-18: qty 1

## 2023-08-18 NOTE — Discharge Instructions (Signed)
Your cardiac workup today was reassuring.  We suspect that your pain is coming from your shoulder. Please follow-up with your primary care doctor and/or your sports medicine specialist. Return here for new concerns.

## 2023-08-18 NOTE — ED Provider Notes (Signed)
[place] EMERGENCY DEPARTMENT AT [facility] Provider Note   CSN: [id number] Arrival date & time: 08/17/23  2157     History  Chief Complaint  Patient presents with   Chest Pain   Shortness of Breath    Jill Shah is a 62 y.o. female.  The history is provided by the patient and medical records.  Chest Pain Associated symptoms: shortness of breath   Shortness of Breath Associated symptoms: chest pain    62 year old female with history of degenerative disc disease, diabetes, hyperlipidemia, hypertension, obesity, history of shoulder issues, presenting to the ED with chest pain and left shoulder pain.  Patient reports this has been ongoing for about 3 weeks now.  Feels like pain initially started in the shoulder but sometimes feeling this into the left chest and into the neck.  She denies any injury, trauma, or falls.  Does intermittently have sensation of her hand falling asleep but this resolves when she moves her fingers around.  She does not have any focal weakness.  She has no prior cardiac history.  He has been taking Aleve with some transient relief in her symptoms but pain does seem worse over the past 2 days.  Home Medications Prior to Admission medications   Medication Sig Start Date End Date Taking? Authorizing Provider  albuterol (VENTOLIN HFA) 108 (90 Base) MCG/ACT inhaler Inhale 1-2 puffs into the lungs every 6 (six) hours as needed for wheezing or shortness of breath. 12/03/22   Nche, Bonna Gains, NP  amLODipine (NORVASC) 10 MG tablet Take 1 tablet (10 mg total) by mouth daily. 09/11/22   Nche, Bonna Gains, NP  aspirin EC 81 MG tablet Take 81 mg by mouth daily. Swallow whole.    [provider]  benzonatate (TESSALON) 100 MG capsule Take 1-2 capsules (100-200 mg total) by mouth 3 (three) times daily as needed. 01/12/23   Nche, Bonna Gains, NP  losartan-hydrochlorothiazide (HYZAAR) 100-12.5 MG tablet Take 1 tablet by mouth daily.  01/12/23   Nche, Bonna Gains, NP  meloxicam (MOBIC) 7.5 MG tablet TAKE 1 TABLET(7.5 MG) BY MOUTH DAILY 12/02/22   Judi Saa, DO  metFORMIN (GLUCOPHAGE-XR) 500 MG 24 hr tablet Take 1 tablet (500 mg total) by mouth 2 (two) times daily with a meal. 12/11/22   Nche, Bonna Gains, NP  potassium chloride SA (KLOR-CON M) 20 MEQ tablet Take 1 tablet (20 mEq total) by mouth 2 (two) times daily. 07/21/22   Nche, Bonna Gains, NP  rosuvastatin (CRESTOR) 20 MG tablet Take 0.5 tablets (10 mg total) by mouth 3 (three) times a week. 01/12/23   Nche, Bonna Gains, NP      Allergies    Lisinopril    Review of Systems   Review of Systems  Respiratory:  Positive for shortness of breath.   Cardiovascular:  Positive for chest pain.  All other systems reviewed and are negative.   Physical Exam Updated Vital Signs BP (!) 130/100 (BP Location: Right Arm)   Pulse 66   Temp 98.2 F (36.8 C) (Oral)   Resp 15   Ht 5' (1.524 m)   Wt 87.1 kg   SpO2 99%   BMI 37.50 kg/m   Physical Exam Vitals and nursing note reviewed.  Constitutional:      Appearance: She is well-developed.  HENT:     Head: Normocephalic and atraumatic.  Eyes:     Conjunctiva/sclera: Conjunctivae normal.     Pupils: Pupils are equal, round, and reactive to  light.  Cardiovascular:     Rate and Rhythm: Normal rate and regular rhythm.     Heart sounds: Normal heart sounds.  Pulmonary:     Effort: Pulmonary effort is normal.     Breath sounds: Normal breath sounds.  Abdominal:     General: Bowel sounds are normal.     Palpations: Abdomen is soft.  Musculoskeletal:        General: Normal range of motion.     Cervical back: Normal range of motion.     Comments: Reproducible tenderness of left shoulder, there is no swelling or acute deformity, no bruising or signs of trauma, left arm NVI  Skin:    General: Skin is warm and dry.  Neurological:     Mental Status: She is alert and oriented to person, place, and time.     ED  Results / Procedures / Treatments   Labs (all labs ordered are listed, but only abnormal results are displayed) Labs Reviewed  BASIC METABOLIC PANEL - Abnormal; Notable for the following components:      Result Value   Potassium 3.2 (*)    All other components within normal limits  CBC - Abnormal; Notable for the following components:   Hemoglobin 11.6 (*)    RDW 16.8 (*)    All other components within normal limits  TROPONIN I (HIGH SENSITIVITY)  TROPONIN I (HIGH SENSITIVITY)    EKG None  Radiology DG Chest 2 View  Result Date: 08/18/2023 CLINICAL DATA:  Left-sided chest pain. EXAM: CHEST - 2 VIEW COMPARISON:  December 29, 2018 FINDINGS: The heart size and mediastinal contours are within normal limits. Mild linear atelectasis is seen along the periphery of the left lung base. No pleural effusion or pneumothorax is identified. There is moderate severity levoscoliosis of the lower thoracic spine and upper lumbar spine with postoperative changes seen within the visualized portion of the mid lumbar spine. IMPRESSION: Mild left basilar linear atelectasis. Electronically Signed   By: Aram Candela M.D.   On: 08/18/2023 00:15    Procedures Procedures    Medications Ordered in ED Medications  oxyCODONE-acetaminophen (PERCOCET/ROXICET) 5-325 MG per tablet 1 tablet (1 tablet Oral Given 08/18/23 0046)    ED Course/ Medical Decision Making/ A&P                                 Medical Decision Making Amount and/or Complexity of Data Reviewed Labs: ordered. Radiology: ordered and independent interpretation performed. ECG/medicine tests: ordered and independent interpretation performed.  Risk Prescription drug management.   62 year old female presenting to the ED with chest pain.  After talking with her it seems like this is more left shoulder pain with radiation into the chest and neck.  She has had some relief with Aleve.  Her EKG is nonischemic.  Labs are reassuring without  leukocytosis or electrolyte derangement.  First troponin is negative.  Chest x-ray is clear.  She does have reproducible pain with palpation of the left shoulder on exam.  I suspect this is more musculoskeletal, however given her age and comorbidities she does have some risk factors.  Will obtain delta troponin.  She is given oxycodone for pain.  Will reassess.  Repeat troponin flat and negative at 9.  Lower suspicion for ACS.  Continue to suspect related to left shoulder with MSK etiology.  She does follow with sports medicine, Dr. Katrinka Blazing, so we will have her follow-up with  him.  Can also see PCP in the interim.  Return here for new concerns.  Final Clinical Impression(s) / ED Diagnoses Final diagnoses:  Chest pain in adult  Acute pain of left shoulder    Rx / DC Orders ED Discharge Orders     None         Garlon Hatchet, PA-C 08/18/23 0239    Sabas Sous, MD 08/18/23 [phone]

## 2023-08-19 ENCOUNTER — Ambulatory Visit: Payer: 59 | Admitting: Sports Medicine

## 2023-08-19 VITALS — BP 138/78 | HR 57 | Ht 60.0 in | Wt 190.0 lb

## 2023-08-19 DIAGNOSIS — G8929 Other chronic pain: Secondary | ICD-10-CM | POA: Diagnosis not present

## 2023-08-19 DIAGNOSIS — M4125 Other idiopathic scoliosis, thoracolumbar region: Secondary | ICD-10-CM

## 2023-08-19 DIAGNOSIS — M5134 Other intervertebral disc degeneration, thoracic region: Secondary | ICD-10-CM

## 2023-08-19 MED ORDER — METHYLPREDNISOLONE ACETATE 80 MG/ML IJ SUSP
80.0000 mg | Freq: Once | INTRAMUSCULAR | Status: AC
  Administered 2023-08-19: 80 mg via INTRAMUSCULAR

## 2023-08-19 MED ORDER — KETOROLAC TROMETHAMINE 60 MG/2ML IM SOLN
60.0000 mg | Freq: Once | INTRAMUSCULAR | Status: AC
  Administered 2023-08-19: 60 mg via INTRAMUSCULAR

## 2023-08-19 MED ORDER — MELOXICAM 15 MG PO TABS: 15.0000 mg | ORAL_TABLET | Freq: Every day | ORAL | 0 refills | Status: DC

## 2023-08-19 NOTE — Addendum Note (Signed)
Addended by: Debbe Odea R on: 08/19/2023 02:10 PM   Modules accepted: Orders

## 2023-08-19 NOTE — Progress Notes (Signed)
Jill Shah D.[facility] [address] [place] [address] Phone: [phone]   Assessment and Plan:     1. Chronic bilateral thoracic back pain 2. DDD (degenerative disc disease), thoracic 3. Other idiopathic scoliosis, thoracolumbar region - Chronic with exacerbation, subsequent visit - Patient with flare of upper back pain radiating around chest and in the left shoulder.  Most consistent with flare of thoracic degenerative changes and thoracolumbar scoliosis leading to back, chest wall, left shoulder, trapezius pain - Reassuring the patient had unremarkable cardiac workup in ED on 08/13/2022 - Patient elected for IM injection of methylprednisone 80 mg/Toradol 60 mg.  Injection given in clinic today and tolerated well. - Start HEP for upper back -Tomorrow, start meloxicam 15 mg daily x2 weeks.  If still having pain after 2 weeks, complete 3rd-week of meloxicam. May use remaining meloxicam as needed once daily for pain control.  Do not to use additional NSAIDs while taking meloxicam.  May use Tylenol [phone] mg 2 to 3 times a day for breakthrough pain.    15 additional minutes spent for educating Therapeutic Home Exercise Program.  This included exercises focusing on stretching, strengthening, with focus on eccentric aspects.   Long term goals include an improvement in range of motion, strength, endurance as well as avoiding reinjury. Patient's frequency would include in 1-2 times a day, 3-5 times a week for a duration of 6-12 weeks. Proper technique shown and discussed handout in great detail with ATC.  All questions were discussed and answered.    Pertinent previous records reviewed include ER note 08/13/2022  Follow Up: 4 weeks for reevaluation.  Could consider OMT versus physical therapy if no improvement or worsening of symptoms   Subjective:   I, Jill Shah, am serving as a Neurosurgeon for Doctor Richardean Sale   Chief Complaint: back  and hip pain    HPI:    02/06/22 Patient is a 62 year old female complaining of back and hip pain. Patient states that she must have twisted or turned the wrong way last time she dr Katrinka Blazing he gave her a shot and it felt better, been going on for 2 months on a dn off , no numbness or tingling just constant pain cant sit for more than 15-20 minutes now , radiating across the whole low back and down the back of her leg sometimes  08/19/2023 Patient states that she is in back flare . Thought she was having a heart attack went to the ED    Relevant Historical Information: History of L4-5 lumbar fusion, lumbar DDD, left-sided sciatica   Additional pertinent review of systems negative.  Additional pertinent review of systems negative.   Current Outpatient Medications:    albuterol (VENTOLIN HFA) 108 (90 Base) MCG/ACT inhaler, Inhale 1-2 puffs into the lungs every 6 (six) hours as needed for wheezing or shortness of breath., Disp: 8 g, Rfl: 0   amLODipine (NORVASC) 10 MG tablet, Take 1 tablet (10 mg total) by mouth daily., Disp: 90 tablet, Rfl: 3   aspirin EC 81 MG tablet, Take 81 mg by mouth daily. Swallow whole., Disp: , Rfl:    benzonatate (TESSALON) 100 MG capsule, Take 1-2 capsules (100-200 mg total) by mouth 3 (three) times daily as needed., Disp: 30 capsule, Rfl: 0   losartan-hydrochlorothiazide (HYZAAR) 100-12.5 MG tablet, Take 1 tablet by mouth daily., Disp: 30 tablet, Rfl: 5   meloxicam (MOBIC) 15 MG tablet, Take 1 tablet (15 mg total) by  mouth daily., Disp: 30 tablet, Rfl: 0   meloxicam (MOBIC) 7.5 MG tablet, TAKE 1 TABLET(7.5 MG) BY MOUTH DAILY, Disp: 30 tablet, Rfl: 0   metFORMIN (GLUCOPHAGE-XR) 500 MG 24 hr tablet, Take 1 tablet (500 mg total) by mouth 2 (two) times daily with a meal., Disp: 180 tablet, Rfl: 1   potassium chloride SA (KLOR-CON M) 20 MEQ tablet, Take 1 tablet (20 mEq total) by mouth 2 (two) times daily., Disp: 6 tablet, Rfl: 0   rosuvastatin (CRESTOR) 20 MG tablet,  Take 0.5 tablets (10 mg total) by mouth 3 (three) times a week., Disp: 90 tablet, Rfl: 3   Objective:     Vitals:   08/19/23 1327  BP: 138/78  Pulse: (!) 57  SpO2: 97%  Weight: 190 lb (86.2 kg)  Height: 5' (1.524 m)      Body mass index is 37.11 kg/m.    Physical Exam:    Gen: Appears well, nad, nontoxic and pleasant Psych: Alert and oriented, appropriate mood and affect Neuro: sensation intact, strength is 5/5 in upper and lower extremities, muscle tone wnl Skin: no susupicious lesions or rashes  Back - Normal skin, Spine with normal alignment and no deformity.   No tenderness to vertebral process palpation.   Thoracic paraspinous muscles are   tender and without spasm TTP left trapezius, left anterior rib cage  Gait normal    Electronically signed by:  Jill Shah D.[facility] 1:55 PM 08/19/23

## 2023-08-19 NOTE — Patient Instructions (Addendum)
-   Start meloxicam 15 mg  tomorrow daily x2 weeks.  If still having pain after 2 weeks, complete 3rd-week of meloxicam. May use remaining meloxicam as needed once daily for pain control.  Do not to use additional NSAIDs while taking meloxicam.  May use Tylenol [phone] mg 2 to 3 times a day for breakthrough pain. Thoracic HEP  4 week follow up

## 2023-08-20 ENCOUNTER — Telehealth: Payer: Self-pay

## 2023-08-20 NOTE — Transitions of Care (Post Inpatient/ED Visit) (Signed)
08/20/2023  Name: Jill Shah MRN: [id number] DOB: October 17, 1960  Today's TOC FU Call Status: Today's TOC FU Call Status:: Successful TOC FU Call Completed TOC FU Call Complete Date: 08/20/23 Patient's Name and Date of Birth confirmed.  Transition Care Management Follow-up Telephone Call Date of Discharge: 08/17/23 Discharge Facility: [facility]) Type of Discharge: Emergency Department Reason for ED Visit: Orthopedic Conditions, Other: How have you been since you were released from the hospital?: Better Any questions or concerns?: No  Items Reviewed: Did you receive and understand the discharge instructions provided?: Yes Medications obtained,verified, and reconciled?: Yes (Medications Reviewed) Any new allergies since your discharge?: No Dietary orders reviewed?: No Do you have support at home?: Yes People in Home: spouse  Medications Reviewed Today: Medications Reviewed Today     Reviewed by Paulita Fujita, Elpidio Anis, CMA (Certified Medical Assistant) on 08/20/23 at 1551  Med List Status: <None>   Medication Order Taking? Sig Documenting Provider Last Dose Status Informant  albuterol (VENTOLIN HFA) 108 (90 Base) MCG/ACT inhaler [id number] Yes Inhale 1-2 puffs into the lungs every 6 (six) hours as needed for wheezing or shortness of breath. Nche, Bonna Gains, NP Taking Active   amLODipine (NORVASC) 10 MG tablet [id number] Yes Take 1 tablet (10 mg total) by mouth daily. Anne Ng, NP Taking Active   aspirin EC 81 MG tablet [id number] Yes Take 81 mg by mouth daily. Swallow whole. [provider] Taking Active   benzonatate (TESSALON) 100 MG capsule [id number] No Take 1-2 capsules (100-200 mg total) by mouth 3 (three) times daily as needed.  Patient not taking: Reported on 08/20/2023   Anne Ng, NP Not Taking Active   losartan-hydrochlorothiazide [facility]) 100-12.5 MG tablet [id number] No Take 1 tablet by mouth daily.  Patient not taking:  Reported on 08/20/2023   Anne Ng, NP Not Taking Active   meloxicam (MOBIC) 15 MG tablet [id number] Yes Take 1 tablet (15 mg total) by mouth daily. Richardean Sale, DO Taking Active   meloxicam (MOBIC) 7.5 MG tablet [id number] No TAKE 1 TABLET(7.5 MG) BY MOUTH DAILY  Patient not taking: Reported on 08/20/2023   Judi Saa, DO Not Taking Active   metFORMIN (GLUCOPHAGE-XR) 500 MG 24 hr tablet [id number] No Take 1 tablet (500 mg total) by mouth 2 (two) times daily with a meal.  Patient not taking: Reported on 08/20/2023   Anne Ng, NP Not Taking Active   potassium chloride SA (KLOR-CON M) 20 MEQ tablet [id number] No Take 1 tablet (20 mEq total) by mouth 2 (two) times daily.  Patient not taking: Reported on 08/20/2023   Nche, Bonna Gains, NP Not Taking Active   rosuvastatin (CRESTOR) 20 MG tablet [id number] No Take 0.5 tablets (10 mg total) by mouth 3 (three) times a week.  Patient not taking: Reported on 08/20/2023   NcheBonna Gains, NP Not Taking Active             Home Care and Equipment/Supplies: Were Home Health Services Ordered?: No Any new equipment or medical supplies ordered?: No  Functional Questionnaire: Do you need assistance with bathing/showering or dressing?: No Do you need assistance with meal preparation?: No Do you need assistance with eating?: No Do you have difficulty maintaining continence: No Do you need assistance with getting out of bed/getting out of a chair/moving?: No Do you have difficulty managing or taking your medications?: No  Follow up appointments reviewed: PCP Follow-up appointment confirmed?: NA (Patient saw ortho and will follow  up again with them in 3 weeks) Specialist Hospital Follow-up appointment confirmed?: Yes Date of Specialist follow-up appointment?: 08/19/23 Do you need transportation to your follow-up appointment?: No Do you understand care options if your condition(s) worsen?: Yes-patient verbalized  understanding    SIGNATURE BMS

## 2023-09-08 NOTE — Progress Notes (Unsigned)
    Jill Shah D[name] [address] [place] [address] Phone: [phone]   Assessment and Plan:     There are no diagnoses linked to this encounter.  ***   Pertinent previous records reviewed include ***    Follow Up: ***     Subjective:   I, Jill Shah, am serving as a Neurosurgeon for Jill Shah   Chief Complaint: back and hip pain    HPI:    02/06/22 Patient is a 62 year old female complaining of back and hip pain. Patient states that she must have twisted or turned the wrong way last time she dr Katrinka Blazing he gave her a shot and it felt better, been going on for 2 months on a dn off , no numbness or tingling just constant pain cant sit for more than 15-20 minutes now , radiating across the whole low back and down the back of her leg sometimes   08/19/2023 Patient states that she is in back flare . 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   Current Outpatient Medications:    albuterol (VENTOLIN HFA) 108 (90 Base) MCG/ACT inhaler, Inhale 1-2 puffs into the lungs every 6 (six) hours as needed for wheezing or shortness of breath., Disp: 8 g, Rfl: 0   amLODipine (NORVASC) 10 MG tablet, Take 1 tablet (10 mg total) by mouth daily., Disp: 90 tablet, Rfl: 3   aspirin EC 81 MG tablet, Take 81 mg by mouth daily. Swallow whole., Disp: , Rfl:    benzonatate (TESSALON) 100 MG capsule, Take 1-2 capsules (100-200 mg total) by mouth 3 (three) times daily as needed. (Patient not taking: Reported on 08/20/2023), Disp: 30 capsule, Rfl: 0   losartan-hydrochlorothiazide (HYZAAR) 100-12.5 MG tablet, Take 1 tablet by mouth daily. (Patient not taking: Reported on 08/20/2023), Disp: 30 tablet, Rfl: 5   meloxicam (MOBIC) 15 MG tablet, Take 1 tablet (15 mg  total) by mouth daily., Disp: 30 tablet, Rfl: 0   meloxicam (MOBIC) 7.5 MG tablet, TAKE 1 TABLET(7.5 MG) BY MOUTH DAILY (Patient not taking: Reported on 08/20/2023), Disp: 30 tablet, Rfl: 0   metFORMIN (GLUCOPHAGE-XR) 500 MG 24 hr tablet, Take 1 tablet (500 mg total) by mouth 2 (two) times daily with a meal. (Patient not taking: Reported on 08/20/2023), Disp: 180 tablet, Rfl: 1   potassium chloride SA (KLOR-CON M) 20 MEQ tablet, Take 1 tablet (20 mEq total) by mouth 2 (two) times daily. (Patient not taking: Reported on 08/20/2023), Disp: 6 tablet, Rfl: 0   rosuvastatin (CRESTOR) 20 MG tablet, Take 0.5 tablets (10 mg total) by mouth 3 (three) times a week. (Patient not taking: Reported on 08/20/2023), Disp: 90 tablet, Rfl: 3   Objective:     There were no vitals filed for this visit.    There is no height or weight on file to calculate BMI.    Physical Exam:    ***   Electronically signed by:  Jill Shah [name] 11:29 AM 09/08/23

## 2023-09-09 ENCOUNTER — Ambulatory Visit: Payer: 59 | Admitting: Sports Medicine

## 2023-09-28 NOTE — Progress Notes (Deleted)
    Ben Jackson D.[facility] [address] [place] [address] Phone: [phone]   Assessment and Plan:     There are no diagnoses linked to this encounter.  ***   Pertinent previous records reviewed include ***    Follow Up: ***     Subjective:   I, Orlene Salmons, am serving as a neurosurgeon for Doctor Morene Mace   Chief Complaint: back and hip pain    HPI:    02/06/22 Patient is a 62 year old female complaining of back and hip pain. Patient states that she must have twisted or turned the wrong way last time she dr claudene he gave her a shot and it felt better, been going on for 2 months on a dn off , no numbness or tingling just constant pain cant sit for more than 15-20 minutes now , radiating across the whole low back and down the back of her leg sometimes   08/19/2023 Patient states that she is in back flare . Thought she was having a heart attack went to the ED   10/05/2023 Patient states   Relevant Historical Information: History of L4-5 lumbar fusion, lumbar DDD, left-sided sciatica   Additional pertinent review of systems negative.   Current Outpatient Medications:    albuterol  (VENTOLIN  HFA) 108 (90 Base) MCG/ACT inhaler, Inhale 1-2 puffs into the lungs every 6 (six) hours as needed for wheezing or shortness of breath., Disp: 8 g, Rfl: 0   amLODipine  (NORVASC ) 10 MG tablet, Take 1 tablet (10 mg total) by mouth daily., Disp: 90 tablet, Rfl: 3   aspirin EC 81 MG tablet, Take 81 mg by mouth daily. Swallow whole., Disp: , Rfl:    benzonatate  (TESSALON ) 100 MG capsule, Take 1-2 capsules (100-200 mg total) by mouth 3 (three) times daily as needed. (Patient not taking: Reported on 08/20/2023), Disp: 30 capsule, Rfl: 0   losartan -hydrochlorothiazide  (HYZAAR) 100-12.5 MG tablet, Take 1 tablet by mouth daily. (Patient not taking: Reported on 08/20/2023), Disp: 30 tablet, Rfl: 5   meloxicam  (MOBIC ) 15 MG tablet, Take 1 tablet (15 mg  total) by mouth daily., Disp: 30 tablet, Rfl: 0   meloxicam  (MOBIC ) 7.5 MG tablet, TAKE 1 TABLET(7.5 MG) BY MOUTH DAILY (Patient not taking: Reported on 08/20/2023), Disp: 30 tablet, Rfl: 0   metFORMIN  (GLUCOPHAGE -XR) 500 MG 24 hr tablet, Take 1 tablet (500 mg total) by mouth 2 (two) times daily with a meal. (Patient not taking: Reported on 08/20/2023), Disp: 180 tablet, Rfl: 1   potassium chloride  SA (KLOR-CON  M) 20 MEQ tablet, Take 1 tablet (20 mEq total) by mouth 2 (two) times daily. (Patient not taking: Reported on 08/20/2023), Disp: 6 tablet, Rfl: 0   rosuvastatin  (CRESTOR ) 20 MG tablet, Take 0.5 tablets (10 mg total) by mouth 3 (three) times a week. (Patient not taking: Reported on 08/20/2023), Disp: 90 tablet, Rfl: 3   Objective:     There were no vitals filed for this visit.    There is no height or weight on file to calculate BMI.    Physical Exam:    ***   Electronically signed by:  Odis Mace D[facility] 8:14 AM 09/28/23

## 2023-10-05 ENCOUNTER — Ambulatory Visit: Payer: 59 | Admitting: Sports Medicine

## 2023-10-15 NOTE — Progress Notes (Deleted)
    Jill Shah [name] [address] [place] [address] Phone: [phone]   Assessment and Plan:     There are no diagnoses linked to this encounter.  ***   Pertinent previous records reviewed include ***    Follow Up: ***     Subjective:   I, Jill Shah, am serving as a Neurosurgeon for Doctor Richardean Sale   Chief Complaint: back and hip pain    HPI:    02/06/22 Patient is a 63 year old female complaining of back and hip pain. Patient states that she must have twisted or turned the wrong way last time she dr Katrinka Blazing he gave her a shot and it felt better, been going on for 2 months on a dn off , no numbness or tingling just constant pain cant sit for more than 15-20 minutes now , radiating across the whole low back and down the back of her leg sometimes   08/19/2023 Patient states that she is in back flare . Thought she was having a heart attack went to the ED   10/16/2023 Patient states   Relevant Historical Information: History of L4-5 lumbar fusion, lumbar DDD, left-sided sciatica   Additional pertinent review of systems negative.   Current Outpatient Medications:    albuterol (VENTOLIN HFA) 108 (90 Base) MCG/ACT inhaler, Inhale 1-2 puffs into the lungs every 6 (six) hours as needed for wheezing or shortness of breath., Disp: 8 g, Rfl: 0   amLODipine (NORVASC) 10 MG tablet, Take 1 tablet (10 mg total) by mouth daily., Disp: 90 tablet, Rfl: 3   aspirin EC 81 MG tablet, Take 81 mg by mouth daily. Swallow whole., Disp: , Rfl:    benzonatate (TESSALON) 100 MG capsule, Take 1-2 capsules (100-200 mg total) by mouth 3 (three) times daily as needed. (Patient not taking: Reported on 08/20/2023), Disp: 30 capsule, Rfl: 0   losartan-hydrochlorothiazide (HYZAAR) 100-12.5 MG tablet, Take 1 tablet by mouth daily. (Patient not taking: Reported on 08/20/2023), Disp: 30 tablet, Rfl: 5   meloxicam (MOBIC) 15 MG tablet, Take 1 tablet (15 mg  total) by mouth daily., Disp: 30 tablet, Rfl: 0   meloxicam (MOBIC) 7.5 MG tablet, TAKE 1 TABLET(7.5 MG) BY MOUTH DAILY (Patient not taking: Reported on 08/20/2023), Disp: 30 tablet, Rfl: 0   metFORMIN (GLUCOPHAGE-XR) 500 MG 24 hr tablet, Take 1 tablet (500 mg total) by mouth 2 (two) times daily with a meal. (Patient not taking: Reported on 08/20/2023), Disp: 180 tablet, Rfl: 1   potassium chloride SA (KLOR-CON M) 20 MEQ tablet, Take 1 tablet (20 mEq total) by mouth 2 (two) times daily. (Patient not taking: Reported on 08/20/2023), Disp: 6 tablet, Rfl: 0   rosuvastatin (CRESTOR) 20 MG tablet, Take 0.5 tablets (10 mg total) by mouth 3 (three) times a week. (Patient not taking: Reported on 08/20/2023), Disp: 90 tablet, Rfl: 3   Objective:     There were no vitals filed for this visit.    There is no height or weight on file to calculate BMI.    Physical Exam:    ***   Electronically signed by:  Jill Shah [name] 7:36 AM 10/15/23

## 2023-10-16 ENCOUNTER — Ambulatory Visit: Payer: 59 | Admitting: Sports Medicine

## 2023-10-28 NOTE — Progress Notes (Deleted)
    Jill Shah [name] [address] [place] [address] Phone: [phone]   Assessment and Plan:     There are no diagnoses linked to this encounter.  ***   Pertinent previous records reviewed include ***    Follow Up: ***     Subjective:   I, Jill Shah, am serving as a Neurosurgeon for Doctor Richardean Sale   Chief Complaint: back and hip pain    HPI:    02/06/22 Patient is a 63 year old female complaining of back and hip pain. Patient states that she must have twisted or turned the wrong way last time she dr Katrinka Blazing he gave her a shot and it felt better, been going on for 2 months on a dn off , no numbness or tingling just constant pain cant sit for more than 15-20 minutes now , radiating across the whole low back and down the back of her leg sometimes   08/19/2023 Patient states that she is in back flare . 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   Current Outpatient Medications:    albuterol (VENTOLIN HFA) 108 (90 Base) MCG/ACT inhaler, Inhale 1-2 puffs into the lungs every 6 (six) hours as needed for wheezing or shortness of breath., Disp: 8 g, Rfl: 0   amLODipine (NORVASC) 10 MG tablet, Take 1 tablet (10 mg total) by mouth daily., Disp: 90 tablet, Rfl: 3   aspirin EC 81 MG tablet, Take 81 mg by mouth daily. Swallow whole., Disp: , Rfl:    benzonatate (TESSALON) 100 MG capsule, Take 1-2 capsules (100-200 mg total) by mouth 3 (three) times daily as needed. (Patient not taking: Reported on 08/20/2023), Disp: 30 capsule, Rfl: 0   losartan-hydrochlorothiazide (HYZAAR) 100-12.5 MG tablet, Take 1 tablet by mouth daily. (Patient not taking: Reported on 08/20/2023), Disp: 30 tablet, Rfl: 5   meloxicam (MOBIC) 15 MG tablet, Take 1 tablet (15 mg  total) by mouth daily., Disp: 30 tablet, Rfl: 0   meloxicam (MOBIC) 7.5 MG tablet, TAKE 1 TABLET(7.5 MG) BY MOUTH DAILY (Patient not taking: Reported on 08/20/2023), Disp: 30 tablet, Rfl: 0   metFORMIN (GLUCOPHAGE-XR) 500 MG 24 hr tablet, Take 1 tablet (500 mg total) by mouth 2 (two) times daily with a meal. (Patient not taking: Reported on 08/20/2023), Disp: 180 tablet, Rfl: 1   potassium chloride SA (KLOR-CON M) 20 MEQ tablet, Take 1 tablet (20 mEq total) by mouth 2 (two) times daily. (Patient not taking: Reported on 08/20/2023), Disp: 6 tablet, Rfl: 0   rosuvastatin (CRESTOR) 20 MG tablet, Take 0.5 tablets (10 mg total) by mouth 3 (three) times a week. (Patient not taking: Reported on 08/20/2023), Disp: 90 tablet, Rfl: 3   Objective:     There were no vitals filed for this visit.    There is no height or weight on file to calculate BMI.    Physical Exam:    ***   Electronically signed by:  Jill Shah [name] 7:31 AM 10/28/23

## 2023-10-29 ENCOUNTER — Ambulatory Visit: Payer: 59 | Admitting: Sports Medicine

## 2023-11-17 NOTE — Progress Notes (Deleted)
    Jill Shah [name] [address] [place] [address] Phone: [phone]   Assessment and Plan:     There are no diagnoses linked to this encounter.  ***   Pertinent previous records reviewed include ***    Follow Up: ***     Subjective:   I, Jill Shah, am serving as a Neurosurgeon for Doctor Richardean Sale   Chief Complaint: back and hip pain    HPI:    02/06/22 Patient is a 63 year old female complaining of back and hip pain. Patient states that she must have twisted or turned the wrong way last time she dr Katrinka Blazing he gave her a shot and it felt better, been going on for 2 months on a dn off , no numbness or tingling just constant pain cant sit for more than 15-20 minutes now , radiating across the whole low back and down the back of her leg sometimes   08/19/2023 Patient states that she is in back flare . 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   Current Outpatient Medications:    albuterol (VENTOLIN HFA) 108 (90 Base) MCG/ACT inhaler, Inhale 1-2 puffs into the lungs every 6 (six) hours as needed for wheezing or shortness of breath., Disp: 8 g, Rfl: 0   amLODipine (NORVASC) 10 MG tablet, Take 1 tablet (10 mg total) by mouth daily., Disp: 90 tablet, Rfl: 3   aspirin EC 81 MG tablet, Take 81 mg by mouth daily. Swallow whole., Disp: , Rfl:    benzonatate (TESSALON) 100 MG capsule, Take 1-2 capsules (100-200 mg total) by mouth 3 (three) times daily as needed. (Patient not taking: Reported on 08/20/2023), Disp: 30 capsule, Rfl: 0   losartan-hydrochlorothiazide (HYZAAR) 100-12.5 MG tablet, Take 1 tablet by mouth daily. (Patient not taking: Reported on 08/20/2023), Disp: 30 tablet, Rfl: 5   meloxicam (MOBIC) 15 MG tablet, Take 1 tablet (15 mg  total) by mouth daily., Disp: 30 tablet, Rfl: 0   meloxicam (MOBIC) 7.5 MG tablet, TAKE 1 TABLET(7.5 MG) BY MOUTH DAILY (Patient not taking: Reported on 08/20/2023), Disp: 30 tablet, Rfl: 0   metFORMIN (GLUCOPHAGE-XR) 500 MG 24 hr tablet, Take 1 tablet (500 mg total) by mouth 2 (two) times daily with a meal. (Patient not taking: Reported on 08/20/2023), Disp: 180 tablet, Rfl: 1   potassium chloride SA (KLOR-CON M) 20 MEQ tablet, Take 1 tablet (20 mEq total) by mouth 2 (two) times daily. (Patient not taking: Reported on 08/20/2023), Disp: 6 tablet, Rfl: 0   rosuvastatin (CRESTOR) 20 MG tablet, Take 0.5 tablets (10 mg total) by mouth 3 (three) times a week. (Patient not taking: Reported on 08/20/2023), Disp: 90 tablet, Rfl: 3   Objective:     There were no vitals filed for this visit.    There is no height or weight on file to calculate BMI.    Physical Exam:    ***   Electronically signed by:  Jill Shah [name] 7:30 AM 11/17/23

## 2023-11-18 ENCOUNTER — Ambulatory Visit: Payer: 59 | Admitting: Sports Medicine

## 2023-11-19 NOTE — Progress Notes (Unsigned)
    Jill Shah [name] [address] [place] [address] Phone: [phone]   Assessment and Plan:     There are no diagnoses linked to this encounter.  ***   Pertinent previous records reviewed include ***    Follow Up: ***     Subjective:   I, Jill Shah, am serving as a Neurosurgeon for Doctor Richardean Sale   Chief Complaint: back and hip pain    HPI:    02/06/22 Patient is a 63 year old female complaining of back and hip pain. Patient states that she must have twisted or turned the wrong way last time she dr Katrinka Blazing he gave her a shot and it felt better, been going on for 2 months on a dn off , no numbness or tingling just constant pain cant sit for more than 15-20 minutes now , radiating across the whole low back and down the back of her leg sometimes   08/19/2023 Patient states that she is in back flare . 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   Current Outpatient Medications:    albuterol (VENTOLIN HFA) 108 (90 Base) MCG/ACT inhaler, Inhale 1-2 puffs into the lungs every 6 (six) hours as needed for wheezing or shortness of breath., Disp: 8 g, Rfl: 0   amLODipine (NORVASC) 10 MG tablet, Take 1 tablet (10 mg total) by mouth daily., Disp: 90 tablet, Rfl: 3   aspirin EC 81 MG tablet, Take 81 mg by mouth daily. Swallow whole., Disp: , Rfl:    benzonatate (TESSALON) 100 MG capsule, Take 1-2 capsules (100-200 mg total) by mouth 3 (three) times daily as needed. (Patient not taking: Reported on 08/20/2023), Disp: 30 capsule, Rfl: 0   losartan-hydrochlorothiazide (HYZAAR) 100-12.5 MG tablet, Take 1 tablet by mouth daily. (Patient not taking: Reported on 08/20/2023), Disp: 30 tablet, Rfl: 5   meloxicam (MOBIC) 15 MG tablet, Take 1 tablet (15 mg  total) by mouth daily., Disp: 30 tablet, Rfl: 0   meloxicam (MOBIC) 7.5 MG tablet, TAKE 1 TABLET(7.5 MG) BY MOUTH DAILY (Patient not taking: Reported on 08/20/2023), Disp: 30 tablet, Rfl: 0   metFORMIN (GLUCOPHAGE-XR) 500 MG 24 hr tablet, Take 1 tablet (500 mg total) by mouth 2 (two) times daily with a meal. (Patient not taking: Reported on 08/20/2023), Disp: 180 tablet, Rfl: 1   potassium chloride SA (KLOR-CON M) 20 MEQ tablet, Take 1 tablet (20 mEq total) by mouth 2 (two) times daily. (Patient not taking: Reported on 08/20/2023), Disp: 6 tablet, Rfl: 0   rosuvastatin (CRESTOR) 20 MG tablet, Take 0.5 tablets (10 mg total) by mouth 3 (three) times a week. (Patient not taking: Reported on 08/20/2023), Disp: 90 tablet, Rfl: 3   Objective:     There were no vitals filed for this visit.    There is no height or weight on file to calculate BMI.    Physical Exam:    ***   Electronically signed by:  Jill Shah [name] 9:08 AM 11/19/23

## 2023-11-20 ENCOUNTER — Ambulatory Visit: Payer: 59 | Admitting: Sports Medicine

## 2023-11-20 VITALS — HR 70 | Ht 60.0 in | Wt 199.0 lb

## 2023-11-20 DIAGNOSIS — M545 Low back pain, unspecified: Secondary | ICD-10-CM

## 2023-11-20 DIAGNOSIS — M4125 Other idiopathic scoliosis, thoracolumbar region: Secondary | ICD-10-CM

## 2023-11-20 DIAGNOSIS — G8929 Other chronic pain: Secondary | ICD-10-CM | POA: Diagnosis not present

## 2023-11-20 DIAGNOSIS — M5134 Other intervertebral disc degeneration, thoracic region: Secondary | ICD-10-CM

## 2023-11-20 MED ORDER — MELOXICAM 15 MG PO TABS
15.0000 mg | ORAL_TABLET | Freq: Every day | ORAL | 0 refills | Status: AC | PRN
Start: 2023-11-20 — End: ?

## 2023-11-20 NOTE — Patient Instructions (Signed)
 Tylenol [phone] mg 2-3 times a day for pain relief  Meloxicam 15 mg daily as needed limit to 1-2 times per week  Recommend talking to insurance company to see if they cover PT for chronic back pain and scoliosis . OR osteopathic manipulation treatments  As needed follow up

## 2024-08-10 ENCOUNTER — Ambulatory Visit: Payer: Self-pay | Admitting: Nurse Practitioner

## 2024-08-10 ENCOUNTER — Ambulatory Visit (INDEPENDENT_AMBULATORY_CARE_PROVIDER_SITE_OTHER): Admitting: Nurse Practitioner

## 2024-08-10 ENCOUNTER — Encounter: Payer: Self-pay | Admitting: Nurse Practitioner

## 2024-08-10 ENCOUNTER — Ambulatory Visit (INDEPENDENT_AMBULATORY_CARE_PROVIDER_SITE_OTHER)

## 2024-08-10 VITALS — BP 146/84 | HR 74 | Temp 98.8°F | Ht 60.0 in | Wt 191.4 lb

## 2024-08-10 DIAGNOSIS — I10 Essential (primary) hypertension: Secondary | ICD-10-CM | POA: Diagnosis not present

## 2024-08-10 DIAGNOSIS — E785 Hyperlipidemia, unspecified: Secondary | ICD-10-CM | POA: Diagnosis not present

## 2024-08-10 DIAGNOSIS — S82891A Other fracture of right lower leg, initial encounter for closed fracture: Secondary | ICD-10-CM

## 2024-08-10 DIAGNOSIS — M25571 Pain in right ankle and joints of right foot: Secondary | ICD-10-CM

## 2024-08-10 DIAGNOSIS — E1169 Type 2 diabetes mellitus with other specified complication: Secondary | ICD-10-CM

## 2024-08-10 LAB — LIPID PANEL
Cholesterol: 283 mg/dL — ABNORMAL HIGH (ref 0–200)
HDL: 57.9 mg/dL (ref >39.00)
LDL Cholesterol: 188 mg/dL — ABNORMAL HIGH (ref 0–99)
NonHDL: 224.86
Total CHOL/HDL Ratio: 5
Triglycerides: 185 mg/dL — ABNORMAL HIGH (ref 0.0–149.0)
VLDL: 37 mg/dL (ref 0.0–40.0)

## 2024-08-10 LAB — COMPREHENSIVE METABOLIC PANEL WITH GFR
ALT: 11 U/L (ref 0–35)
AST: 15 U/L (ref 0–37)
Albumin: 4.1 g/dL (ref 3.5–5.2)
Alkaline Phosphatase: 56 U/L (ref 39–117)
BUN: 11 mg/dL (ref 6–23)
CO2: 28 meq/L (ref 19–32)
Calcium: 9 mg/dL (ref 8.4–10.5)
Chloride: 102 meq/L (ref 96–112)
Creatinine, Ser: 0.82 mg/dL (ref 0.40–1.20)
GFR: 76.63 mL/min (ref >60.00)
Glucose, Bld: 88 mg/dL (ref 70–99)
Potassium: 3.6 meq/L (ref 3.5–5.1)
Sodium: 139 meq/L (ref 135–145)
Total Bilirubin: 0.3 mg/dL (ref 0.2–1.2)
Total Protein: 7.8 g/dL (ref 6.0–8.3)

## 2024-08-10 LAB — POCT GLYCOSYLATED HEMOGLOBIN (HGB A1C): Hemoglobin A1C: 5.9 % — AB (ref 4.0–5.6)

## 2024-08-10 LAB — URIC ACID: Uric Acid, Serum: 5.8 mg/dL (ref 2.4–7.0)

## 2024-08-10 MED ORDER — LOSARTAN POTASSIUM-HCTZ 100-12.5 MG PO TABS: 1.0000 | ORAL_TABLET | Freq: Every day | ORAL | 1 refills | Status: AC

## 2024-08-10 MED ORDER — TRAMADOL HCL 50 MG PO TABS: 50.0000 mg | ORAL_TABLET | Freq: Four times a day (QID) | ORAL | 0 refills | Status: AC | PRN

## 2024-08-10 NOTE — Progress Notes (Signed)
 Established Patient Visit  Patient: Jill Shah   DOB: October 06, 1960   64 y.o. Female  MRN: [id number] Visit Date: 08/10/2024  Subjective:    Chief Complaint  Patient presents with   Foot Swelling    Right ankle/foot swelling and pain for 1 month-using OTC Tylenol  Arthritis and pain gel/roll on medication starting to not help     Ankle Pain  The incident occurred more than 1 week ago. There was no injury mechanism. The pain is present in the right ankle. The quality of the pain is described as aching. The pain is at a severity of 10/10. The pain is severe. The pain has been Constant since onset. Associated symptoms include an inability to bear weight. Pertinent negatives include no loss of motion, loss of sensation, muscle weakness, numbness or tingling. She reports no foreign bodies present. The symptoms are aggravated by movement, palpation and weight bearing. She has tried nothing for the symptoms.   HYPERTENSION, BENIGN ESSENTIAL D/c losartan /hydrochlorothiazide  and amlodipine  due to LE edema Reports she has made dietary modifications. Home BP 160s-180s/80s asymptomatic BP Readings from Last 3 Encounters:  08/10/24 (!) 146/84  08/19/23 138/78  08/18/23 (!) 130/100    Advised to resume losartan /hydrochlorothiazide  100/12.5mg  Repeat CMP F/up in 58month  Hyperlipidemia associated with type 2 diabetes mellitus (HCC) Repeat lipid panel  DM (diabetes mellitus) (HCC) Unable to tolerate metformin  Reports she has made dietary modifications No neuropathy, completed foot exam Advised to schedule DIABETES eye exam  Repeat hgbA1c at 5.9% improved No med needed at this time Check lipid panel, CMP, and UACr F/up in 3months  Reviewed medical, surgical, and social history today  Medications: Outpatient Medications Prior to Visit  Medication Sig   albuterol  (VENTOLIN  HFA) 108 (90 Base) MCG/ACT inhaler Inhale 1-2 puffs into the lungs every 6 (six) hours as  needed for wheezing or shortness of breath.   aspirin EC 81 MG tablet Take 81 mg by mouth daily. Swallow whole.   meloxicam  (MOBIC ) 15 MG tablet Take 1 tablet (15 mg total) by mouth daily as needed for pain.   [DISCONTINUED] amLODipine  (NORVASC ) 10 MG tablet Take 1 tablet (10 mg total) by mouth daily.   [DISCONTINUED] benzonatate  (TESSALON ) 100 MG capsule Take 1-2 capsules (100-200 mg total) by mouth 3 (three) times daily as needed.   [DISCONTINUED] losartan -hydrochlorothiazide  (HYZAAR) 100-12.5 MG tablet Take 1 tablet by mouth daily. (Patient not taking: Reported on 08/10/2024)   [DISCONTINUED] meloxicam  (MOBIC ) 15 MG tablet Take 1 tablet (15 mg total) by mouth daily.   [DISCONTINUED] meloxicam  (MOBIC ) 7.5 MG tablet TAKE 1 TABLET(7.5 MG) BY MOUTH DAILY (Patient not taking: Reported on 08/10/2024)   [DISCONTINUED] metFORMIN  (GLUCOPHAGE -XR) 500 MG 24 hr tablet Take 1 tablet (500 mg total) by mouth 2 (two) times daily with a meal.   [DISCONTINUED] potassium chloride  SA (KLOR-CON  M) 20 MEQ tablet Take 1 tablet (20 mEq total) by mouth 2 (two) times daily.   [DISCONTINUED] rosuvastatin  (CRESTOR ) 20 MG tablet Take 10 mg by mouth 3 (three) times a week.   No facility-administered medications prior to visit.   Reviewed past medical and social history.   ROS per HPI above      Objective:  BP (!) 146/84 (BP Location: Left Arm, Patient Position: Sitting, Cuff Size: Large)   Pulse 74   Temp 98.8 F (37.1 C) (Oral)   Ht 5' (1.524 m)   Wt 191 lb 6.4  oz (86.8 kg)   SpO2 98%   BMI 37.38 kg/m      Physical Exam Vitals and nursing note reviewed.  Cardiovascular:     Rate and Rhythm: Normal rate and regular rhythm.     Pulses: Normal pulses.     Heart sounds: Normal heart sounds.  Pulmonary:     Effort: Pulmonary effort is normal.     Breath sounds: Normal breath sounds.  Musculoskeletal:     Right ankle: Swelling present. No deformity, ecchymosis or lacerations. Tenderness present over the  medial malleolus. No lateral malleolus, ATF ligament, AITF ligament, CF ligament, posterior TF ligament, base of 5th metatarsal or proximal fibula tenderness. Decreased range of motion. Normal pulse.     Right Achilles Tendon: Normal.     Left ankle: Normal.     Right foot: Normal.     Left foot: Normal.  Skin:    Findings: No bruising, erythema or rash.  Neurological:     Mental Status: She is alert and oriented to person, place, and time.     Results for orders placed or performed in visit on 08/10/24  POCT glycosylated hemoglobin (Hb A1C)  Result Value Ref Range   Hemoglobin A1C 5.9 (A) 4.0 - 5.6 %   HbA1c POC (<> result, manual entry)     HbA1c, POC (prediabetic range)     HbA1c, POC (controlled diabetic range)        Assessment & Plan:    Problem List Items Addressed This Visit     DM (diabetes mellitus) (HCC) - Primary   Unable to tolerate metformin  Reports she has made dietary modifications No neuropathy, completed foot exam Advised to schedule DIABETES eye exam  Repeat hgbA1c at 5.9% improved No med needed at this time Check lipid panel, CMP, and UACr F/up in 3months      Relevant Medications   losartan -hydrochlorothiazide  (HYZAAR) 100-12.5 MG tablet   Other Relevant Orders   Comprehensive metabolic panel with GFR   Microalbumin / creatinine urine ratio   POCT glycosylated hemoglobin (Hb A1C) (Completed)   Hyperlipidemia associated with type 2 diabetes mellitus (HCC)   Repeat lipid panel      Relevant Medications   losartan -hydrochlorothiazide  (HYZAAR) 100-12.5 MG tablet   Other Relevant Orders   Lipid panel   Comprehensive metabolic panel with GFR   HYPERTENSION, BENIGN ESSENTIAL   D/c losartan /hydrochlorothiazide  and amlodipine  due to LE edema Reports she has made dietary modifications. Home BP 160s-180s/80s asymptomatic BP Readings from Last 3 Encounters:  08/10/24 (!) 146/84  08/19/23 138/78  08/18/23 (!) 130/100    Advised to resume  losartan /hydrochlorothiazide  100/12.5mg  Repeat CMP F/up in 32month      Relevant Medications   losartan -hydrochlorothiazide  (HYZAAR) 100-12.5 MG tablet   Other Relevant Orders   Comprehensive metabolic panel with GFR   Other Visit Diagnoses       Closed avulsion fracture of right ankle, initial encounter       Relevant Medications   traMADol  (ULTRAM ) 50 MG tablet   Other Relevant Orders   Uric acid   DG Ankle Complete Right (Completed)      Return in about 4 weeks (around 09/07/2024) for HTN.     Roselie Mood, NP

## 2024-08-10 NOTE — Assessment & Plan Note (Signed)
 Unable to tolerate metformin  Reports she has made dietary modifications No neuropathy, completed foot exam Advised to schedule DIABETES eye exam  Repeat hgbA1c at 5.9% improved No med needed at this time Check lipid panel, CMP, and UACr F/up in 3months

## 2024-08-10 NOTE — Patient Instructions (Addendum)
 Go to lab for blood draw and x-ray. Resume lasartan/hydrochlorothiazide  Maintain a low salt mediterranean diet. Schedule appointment for annual DIABETES eye exam and mammogram  Mediterranean Diet A Mediterranean diet is based on the traditions of countries on the [employer]. It focuses on eating more: Fruits and vegetables. Whole grains, beans, nuts, and seeds. Heart-healthy fats. These are fats that are good for your heart. It involves eating less: Dairy. Meat and eggs. Processed foods with added sugar, salt, and fat. This type of diet can help prevent certain conditions. It can also improve outcomes if you have a long-term (chronic) disease, such as kidney or heart disease. What are tips for following this plan? Reading food labels Check packaged foods for: The serving size. For foods such as rice and pasta, the serving size is the amount of cooked product, not dry. The total fat. Avoid foods with saturated fat or trans fat. Added sugars, such as corn syrup. Shopping  Try to have a balanced diet. Buy a variety of foods, such as: Fresh fruits and vegetables. You may be able to get these from local farmers markets. You can also buy them frozen. Grains, beans, nuts, and seeds. Some of these can be bought in bulk. Fresh seafood. Poultry and eggs. Low-fat dairy products. Buy whole ingredients instead of foods that have already been packaged. If you can't get fresh seafood, buy precooked frozen shrimp or canned fish, such as tuna, salmon, or sardines. Stock your pantry so you always have certain foods on hand, such as olive oil, canned tuna, canned tomatoes, rice, pasta, and beans. Cooking Cook foods with extra-virgin olive oil instead of using butter or other vegetable oils. Have meat as a side dish. Have vegetables or grains as your main dish. This means having meat in small portions or adding small amounts of meat to foods like pasta or stew. Use beans or vegetables instead  of meat in common dishes like chili or lasagna. Try out different cooking methods. Try roasting, broiling, steaming, and sauting vegetables. Add frozen vegetables to soups, stews, pasta, or rice. Add nuts or seeds for added healthy fats and plant protein at each meal. You can add these to yogurt, salads, or vegetable dishes. Marinate fish or vegetables using olive oil, lemon juice, garlic, and fresh herbs. Meal planning Plan to eat a vegetarian meal one day each week. Try to work up to two vegetarian meals, if possible. Eat seafood two or more times a week. Have healthy snacks on hand. These may include: Vegetable sticks with hummus. Greek yogurt. Fruit and nut trail mix. Eat balanced meals. These should include: Fruit: 2-3 servings a day. Vegetables: 4-5 servings a day. Low-fat dairy: 2 servings a day. Fish, poultry, or lean meat: 1 serving a day. Beans and legumes: 2 or more servings a week. Nuts and seeds: 1-2 servings a day. Whole grains: 6-8 servings a day. Extra-virgin olive oil: 3-4 servings a day. Limit red meat and sweets to just a few servings a month. Lifestyle  Try to cook and eat meals with your family. Drink enough fluid to keep your pee (urine) pale yellow. Be active every day. This includes: Aerobic exercise, which is exercise that causes your heart to beat faster. Examples include running and swimming. Leisure activities like gardening, walking, or housework. Get 7-8 hours of sleep each night. Drink red wine if your provider says you can. A glass of wine is 5 oz (150 mL). You may be allowed to have: Up to 1 glass  a day if you're female and not pregnant. Up to 2 glasses a day if you're female. What foods should I eat? Fruits Apples. Apricots. Avocado. Berries. Bananas. Cherries. Dates. Figs. Grapes. Lemons. Melon. Oranges. Peaches. Plums. Pomegranate. Vegetables Artichokes. Beets. Broccoli. Cabbage. Carrots. Eggplant. Green beans. Chard. Kale. Spinach. Onions.  Leeks. Peas. Squash. Tomatoes. Peppers. Radishes. Grains Whole-grain pasta. Brown rice. Bulgur wheat. Polenta. Couscous. Whole-wheat bread. [place] Madeira. Meats and other proteins Beans. Almonds. Sunflower seeds. Pine nuts. Peanuts. Cod. Salmon. Scallops. Shrimp. Tuna. Tilapia. Clams. Oysters. Eggs. Chicken or turkey without skin. Dairy Low-fat milk. Cheese. Greek yogurt. Fats and oils Extra-virgin olive oil. Avocado oil. Grapeseed oil. Beverages Water. Red wine. Herbal tea. Sweets and desserts Greek yogurt with honey. Baked apples. Poached pears. Trail mix. Seasonings and condiments Basil. Cilantro. Coriander. Cumin. Mint. Parsley. Sage. Rosemary. Tarragon. Garlic. Oregano. Thyme. Pepper. Balsamic vinegar. Tahini. Hummus. Tomato sauce. Olives. Mushrooms. The items listed above may not be all the foods and drinks you can have. Talk to a dietitian to learn more. What foods should I limit? This is a list of foods that should be eaten rarely. Fruits Fruit canned in syrup. Vegetables Deep-fried potatoes, like French fries. Grains Packaged pasta or rice dishes. Cereal with added sugar. Snacks with added sugar. Meats and other proteins Beef. Pork. Lamb. Chicken or turkey with skin. Hot dogs. [place]. Dairy Ice cream. Sour cream. Whole milk. Fats and oils Butter. Canola oil. Vegetable oil. Beef fat (tallow). Lard. Beverages Juice. Sugar-sweetened soft drinks. Beer. Liquor and spirits. Sweets and desserts Cookies. Cakes. Pies. Candy. Seasonings and condiments Mayonnaise. Pre-made sauces and marinades. The items listed above may not be all the foods and drinks you should limit. Talk to a dietitian to learn more. Where to find more information American Heart Association (AHA): heart.org This information is not intended to replace advice given to you by your health care provider. Make sure you discuss any questions you have with your health care provider. Document Revised: 12/28/2022  Document Reviewed: 12/28/2022 Elsevier Patient Education  2024 [employer].

## 2024-08-10 NOTE — Assessment & Plan Note (Signed)
 D/c losartan /hydrochlorothiazide  and amlodipine  due to LE edema Reports she has made dietary modifications. Home BP 160s-180s/80s asymptomatic BP Readings from Last 3 Encounters:  08/10/24 (!) 146/84  08/19/23 138/78  08/18/23 (!) 130/100    Advised to resume losartan /hydrochlorothiazide  100/12.5mg  Repeat CMP F/up in 2month

## 2024-08-10 NOTE — Assessment & Plan Note (Signed)
 Repeat lipid panel ?

## 2024-08-11 NOTE — Progress Notes (Signed)
 Message read by patient Last read by Cloyd BIRCH Scudder-Francis at 9:27PM on 08/10/2024. Nothing further needed.

## 2024-08-12 ENCOUNTER — Ambulatory Visit: Admitting: Podiatry

## 2024-08-12 ENCOUNTER — Encounter: Payer: Self-pay | Admitting: Podiatry

## 2024-08-12 ENCOUNTER — Ambulatory Visit (INDEPENDENT_AMBULATORY_CARE_PROVIDER_SITE_OTHER)

## 2024-08-12 DIAGNOSIS — M25571 Pain in right ankle and joints of right foot: Secondary | ICD-10-CM

## 2024-08-12 DIAGNOSIS — M65971 Unspecified synovitis and tenosynovitis, right ankle and foot: Secondary | ICD-10-CM | POA: Diagnosis not present

## 2024-08-12 DIAGNOSIS — M79671 Pain in right foot: Secondary | ICD-10-CM | POA: Diagnosis not present

## 2024-08-12 DIAGNOSIS — S82891A Other fracture of right lower leg, initial encounter for closed fracture: Secondary | ICD-10-CM | POA: Diagnosis not present

## 2024-08-12 MED ORDER — TRIAMCINOLONE ACETONIDE 40 MG/ML IJ SUSP
40.0000 mg | Freq: Once | INTRAMUSCULAR | Status: AC
  Administered 2024-08-12: 40 mg

## 2024-08-12 NOTE — Progress Notes (Signed)
 Patient presents with complaint of pain along the medial ankle on the right.  Some pain along the arch on the right also.  Does not recall any specific injury to it.  Pain with, down steps.  Has been swelling.  She has been icing it intermittently.  Got an ankle brace that her husband used to have she says this helps a little bit.  Has not noticed any redness or ecchymosis.   Physical exam:  General appearance: Pleasant, and in no acute distress. AOx3.  Vascular: Pedal pulses: DP 2/4 bilaterally, PT 2/4 bilaterally.  Increased local edema medial ankle right. Capillary fill time immediate bilaterally.  Neurological: Light touch intact feet bilaterally.  Normal Achilles reflex bilaterally.  No clonus or spasticity noted.   Dermatologic:   Skin normal temperature bilaterally.  Skin normal color, tone, and texture bilaterally.   Musculoskeletal: Tenderness along the medial aspect of the ankle along the course of the posterior tibial tendon right.  Normal muscle strength lower extremity bilaterally.  Some tenderness along the medial navicular talonavicular joint right.  Some tenderness on the medial arch right.  Slight tenderness along the anterior medial ankle joint right.  Radiographs: 3 views ankle right: No has any fractures or dislocations.  Normal ankle joint space.  There is a small radiodense body which is likely just an ossification and not a fracture.  Normal talar tilt.  No evidence of any talar dome lesions.  Normal ankle joint space.  Pes planus with sagging at the talonavicular and naviculocuneiform joints  Diagnosis: 1.  Pain right foot. 2.  Posterior tibial tenosynovitis right. 3.  Arthralgia ankle joint right  Plan: -New patient office visit for evaluation and management level 3.  Modifier 25. - Discussed with her posterior tibial tenosynovitis/right.  Maybe having some plantar fasciitis and some arthralgia at the talonavicular joint also.  Right wearing good stable  supportive shoe stay off is much as possible.  Continue wearing the ankle brace as it seems to help.   -RICE -injected 3cc 2:1 mixture 0.5 cc Marcaine:Kenolog 10mg /10ml at posterior tibial tendon sheath right along the medial malleolus..    Return 1 week follow-up injection posterior tibial tendon right

## 2024-09-02 ENCOUNTER — Ambulatory Visit: Admitting: Podiatry

## 2024-09-04 ENCOUNTER — Emergency Department (HOSPITAL_COMMUNITY)

## 2024-09-04 ENCOUNTER — Other Ambulatory Visit: Payer: Self-pay

## 2024-09-04 ENCOUNTER — Encounter (HOSPITAL_COMMUNITY): Payer: Self-pay | Admitting: Emergency Medicine

## 2024-09-04 ENCOUNTER — Emergency Department (HOSPITAL_COMMUNITY)
Admission: EM | Admit: 2024-09-04 | Discharge: 2024-09-04 | Disposition: A | Discharge status: Home / Self Care | Attending: Emergency Medicine | Admitting: Emergency Medicine

## 2024-09-04 DIAGNOSIS — R1011 Right upper quadrant pain: Secondary | ICD-10-CM

## 2024-09-04 LAB — COMPREHENSIVE METABOLIC PANEL WITH GFR
ALT: 15 U/L (ref 0–44)
AST: 20 U/L (ref 15–41)
Albumin: 3.8 g/dL (ref 3.5–5.0)
Alkaline Phosphatase: 56 U/L (ref 38–126)
Anion gap: 9 (ref 5–15)
BUN: 10 mg/dL (ref 8–23)
CO2: 28 mmol/L (ref 22–32)
Calcium: 9 mg/dL (ref 8.9–10.3)
Chloride: 101 mmol/L (ref 98–111)
Creatinine, Ser: 0.92 mg/dL (ref 0.44–1.00)
GFR, Estimated: >60 mL/min (ref >60)
Glucose, Bld: 104 mg/dL — ABNORMAL HIGH (ref 70–99)
Potassium: 3.7 mmol/L (ref 3.5–5.1)
Sodium: 138 mmol/L (ref 135–145)
Total Bilirubin: 0.9 mg/dL (ref 0.0–1.2)
Total Protein: 8 g/dL (ref 6.5–8.1)

## 2024-09-04 LAB — URINALYSIS, ROUTINE W REFLEX MICROSCOPIC
Bilirubin Urine: NEGATIVE
Glucose, UA: NEGATIVE mg/dL
Hgb urine dipstick: NEGATIVE
Ketones, ur: NEGATIVE mg/dL
Leukocytes,Ua: NEGATIVE
Nitrite: NEGATIVE
Protein, ur: NEGATIVE mg/dL
Specific Gravity, Urine: 1.014 (ref 1.005–1.030)
pH: 7 (ref 5.0–8.0)

## 2024-09-04 LAB — LIPASE, BLOOD: Lipase: 21 U/L (ref 11–51)

## 2024-09-04 LAB — CBC
HCT: 38 % (ref 36.0–46.0)
Hemoglobin: 12.2 g/dL (ref 12.0–15.0)
MCH: 27.1 pg (ref 26.0–34.0)
MCHC: 32.1 g/dL (ref 30.0–36.0)
MCV: 84.4 fL (ref 80.0–100.0)
Platelets: 328 K/uL (ref 150–400)
RBC: 4.5 MIL/uL (ref 3.87–5.11)
RDW: 16.7 % — ABNORMAL HIGH (ref 11.5–15.5)
WBC: 6.5 K/uL (ref 4.0–10.5)
nRBC: 0 % (ref 0.0–0.2)

## 2024-09-04 MED ORDER — MORPHINE SULFATE (PF) 4 MG/ML IV SOLN
4.0000 mg | Freq: Once | INTRAVENOUS | Status: AC
  Administered 2024-09-04: 4 mg via INTRAVENOUS
  Filled 2024-09-04: qty 1

## 2024-09-04 MED ORDER — ONDANSETRON HCL 4 MG/2ML IJ SOLN
4.0000 mg | Freq: Once | INTRAMUSCULAR | Status: AC
  Administered 2024-09-04: 4 mg via INTRAVENOUS
  Filled 2024-09-04: qty 2

## 2024-09-04 MED ORDER — OXYCODONE-ACETAMINOPHEN 5-325 MG PO TABS
1.0000 | ORAL_TABLET | Freq: Once | ORAL | Status: AC
  Administered 2024-09-04: 1 via ORAL
  Filled 2024-09-04: qty 1

## 2024-09-04 MED ORDER — OXYCODONE-ACETAMINOPHEN 5-325 MG PO TABS: 1.0000 | ORAL_TABLET | Freq: Three times a day (TID) | ORAL | 0 refills | Status: AC | PRN

## 2024-09-04 MED ORDER — IOHEXOL 350 MG/ML SOLN
75.0000 mL | Freq: Once | INTRAVENOUS | Status: AC | PRN
  Administered 2024-09-04: 75 mL via INTRAVENOUS

## 2024-09-04 MED ORDER — ONDANSETRON HCL 4 MG PO TABS: 4.0000 mg | ORAL_TABLET | Freq: Three times a day (TID) | ORAL | 0 refills | Status: AC | PRN

## 2024-09-04 NOTE — ED Notes (Signed)
 Patient discharged by RN. Patient verbalizes understanding of instructions without additional questions. Ambulatory to lobby at time of discharge.

## 2024-09-04 NOTE — ED Provider Notes (Addendum)
 [facility] Provider Note   CSN: [id number] Arrival date & time: 09/04/24  1051     Patient presents with: Abdominal Pain   Jill Shah is a 63 y.o. female.  With a history of obesity hyperlipidemia type 2 diabetes hypertension who presents to ED for abdominal pain.  Right side abdominal pain began Thanksgiving morning and has persisted since the onset.  Localized over the right upper right lower quadrant with radiation to the back.  Also reporting some atraumatic right shoulder pain.  Associated nausea.  Made worse with certain movements.  Thought it may be due to constipation and took laxatives but this has not provided any relief.  No bloody bowel movements.  No fevers chills.  Prior C-sections and tubal ligation but no other abdominal surgeries    Abdominal Pain      Prior to Admission medications   Medication Sig Start Date End Date Taking? Authorizing Provider  ondansetron  (ZOFRAN ) 4 MG tablet Take 1 tablet (4 mg total) by mouth every 8 (eight) hours as needed for up to 4 days for nausea or vomiting. 09/04/24 09/08/24 Yes Pamella Ozell LABOR, DO  oxyCODONE -acetaminophen  (PERCOCET/ROXICET) 5-325 MG tablet Take 1 tablet by mouth every 8 (eight) hours as needed for up to 3 days for severe pain (pain score 7-10). 09/04/24 09/07/24 Yes Pamella Ozell LABOR, DO  albuterol  (VENTOLIN  HFA) 108 (90 Base) MCG/ACT inhaler Inhale 1-2 puffs into the lungs every 6 (six) hours as needed for wheezing or shortness of breath. 12/03/22   Nche, Roselie Rockford, NP  aspirin EC 81 MG tablet Take 81 mg by mouth daily. Swallow whole.    [provider]  losartan -hydrochlorothiazide  (HYZAAR) 100-12.5 MG tablet Take 1 tablet by mouth daily. 08/10/24   Nche, Roselie Rockford, NP  meloxicam  (MOBIC ) 15 MG tablet Take 1 tablet (15 mg total) by mouth daily as needed for pain. 11/20/23   Leonce Katz, DO    Allergies: Lisinopril     Review of Systems   Gastrointestinal:  Positive for abdominal pain.    Updated Vital Signs BP 132/76   Pulse 62   Temp 99 F (37.2 C)   Resp 17   Ht 5' (1.524 m)   Wt 86.8 kg   SpO2 98%   BMI 37.37 kg/m   Physical Exam Vitals and nursing note reviewed.  HENT:     Head: Normocephalic and atraumatic.  Eyes:     Pupils: Pupils are equal, round, and reactive to light.  Cardiovascular:     Rate and Rhythm: Normal rate and regular rhythm.  Pulmonary:     Effort: Pulmonary effort is normal.     Breath sounds: Normal breath sounds.  Abdominal:     Palpations: Abdomen is soft.     Tenderness: There is abdominal tenderness in the right upper quadrant and right lower quadrant. There is no guarding or rebound.  Skin:    General: Skin is warm and dry.  Neurological:     Mental Status: She is alert.  Psychiatric:        Mood and Affect: Mood normal.     (all labs ordered are listed, but only abnormal results are displayed) Labs Reviewed  COMPREHENSIVE METABOLIC PANEL WITH GFR - Abnormal; Notable for the following components:      Result Value   Glucose, Bld 104 (*)    All other components within normal limits  CBC - Abnormal; Notable for the following components:   RDW 16.7 (*)  All other components within normal limits  LIPASE, BLOOD  URINALYSIS, ROUTINE W REFLEX MICROSCOPIC    EKG: None  Radiology: DG Chest Portable 1 View Result Date: 09/04/2024 CLINICAL DATA:  Possible pneumonia. EXAM: PORTABLE CHEST 1 VIEW COMPARISON:  08/17/2023. FINDINGS: Heart is enlarged and the mediastinal contour is within normal limits. No consolidation, effusion, or pneumothorax is seen. No acute osseous abnormality. IMPRESSION: No active disease. Electronically Signed   By: Leita Birmingham M.D.   On: 09/04/2024 15:53   CT ABDOMEN PELVIS W CONTRAST Result Date: 09/04/2024 CLINICAL DATA:  Right-sided abdominal pain. EXAM: CT ABDOMEN AND PELVIS WITH CONTRAST TECHNIQUE: Multidetector CT imaging of the abdomen and  pelvis was performed using the standard protocol following bolus administration of intravenous contrast. RADIATION DOSE REDUCTION: This exam was performed according to the departmental dose-optimization program which includes automated exposure control, adjustment of the mA and/or kV according to patient size and/or use of iterative reconstruction technique. CONTRAST:  75mL OMNIPAQUE  IOHEXOL  350 MG/ML SOLN COMPARISON:  Prior CT scan from 2018 FINDINGS: Lower chest: Patchy ground-glass opacity and mild interstitial thickening in the lower lung zones bilaterally could reflect acute or chronic inflammatory process or atypical/viral pneumonia. No focal airspace consolidation to suggest pneumonia. No pleural effusions or pulmonary lesions. No pericardial effusion. Hepatobiliary: Stable nodular rim enhancing lesion in segment 5 most consistent with a benign hepatic hemangioma. No worrisome hepatic lesions or intrahepatic biliary dilatation. Small rim calcified gallstones are noted the gallbladder but no CT findings to suggest acute cholecystitis. Normal course and caliber of the common bile duct. Pancreas: No mass, inflammation or ductal dilatation. Spleen: Normal size.  No focal lesions.  Small accessory spleen. Adrenals/Urinary Tract: Adrenal glands and kidneys are unremarkable. No worrisome renal lesions, hydronephrosis or findings to suggest pyelonephritis. The bladder is unremarkable. Stomach/Bowel: The stomach, duodenum, small bowel and colon are unremarkable. No acute inflammatory process, mass lesions or obstructive findings. The terminal ileum and appendix are normal. Minimal scattered colonic diverticulosis. Vascular/Lymphatic: Scattered atherosclerotic calcifications involving the aorta and iliac arteries but no aneurysm or dissection. The major venous structures are patent. No mesenteric or retroperitoneal mass or adenopathy. Reproductive: Stable fibroid uterus with a very elongated lower uterine segment. The  ovaries are normal. Other: Stable periumbilical abdominal wall hernia containing fat. No subcutaneous lesions. Musculoskeletal: Stable L4-5 fusion hardware without complicating features. No worrisome bone lesions. Interval near complete auto interbody fusion at L5-S1. IMPRESSION: 1. No acute abdominal/pelvic findings, mass lesions or adenopathy. 2. Cholelithiasis. 3. Stable hepatic hemangioma. 4. Stable fibroid uterus. 5. Stable periumbilical abdominal wall hernia containing fat. 6. Patchy ground-glass opacity and mild interstitial thickening in the lower lung zones bilaterally could reflect acute or chronic inflammatory process or atypical/viral pneumonia. 7. Aortic atherosclerosis. Aortic Atherosclerosis (ICD10-I70.0). Electronically Signed   By: MYRTIS Stammer M.D.   On: 09/04/2024 14:35     Procedures   Medications Ordered in the ED  ondansetron  (ZOFRAN ) injection 4 mg (4 mg Intravenous Given 09/04/24 1214)  morphine  (PF) 4 MG/ML injection 4 mg (4 mg Intravenous Given 09/04/24 1214)  iohexol  (OMNIPAQUE ) 350 MG/ML injection 75 mL (75 mLs Intravenous Contrast Given 09/04/24 1413)    Clinical Course as of 09/04/24 1615  Sun Sep 04, 2024  1606 CT shows cholelithiasis no evidence of cholecystitis.  No appendicitis.  Fibroid uterus.  Chest x-ray without focal consolidation no respiratory symptoms.  Laboratory workup unremarkable including LFTs.  UA is negative.  Patient's discomfort may be due to either fibroid uterus or symptomatic cholelithiasis.  Will discharge with short course of pain medicine and instruction for surgery follow-up if symptoms persist to discuss potential cholecystectomy on a nonemergent basis. [MP]    Clinical Course User Index [MP] Pamella Ozell LABOR, DO                                 Medical Decision Making 63 year old female with history as above presented to the ED for abdominal pain x 2 weeks.  Localized over right upper quadrant right lower quadrant radiation to the back.   Also reporting some right shoulder pain.  Right upper quadrant right lower quadrant tenderness on my exam.  Afebrile hypertensive.    Differential diagnosis includes: Acute intra-abdominal infectious/inflammatory process such as appendicitis, diverticulitis, pancreatitis and cholecystitis Urinary tract infection Atypical presentation for pneumonia Viral gastroenteritis  Will obtain laboratory workup including CBC with differential, metabolic panel, lipase and urinalysis along with CT abdomen pelvis  Zofran  for nausea and morphine  for pain control  Amount and/or Complexity of Data Reviewed Labs: ordered. Radiology: ordered.  Risk Prescription drug management.        Final diagnoses:  Right upper quadrant abdominal pain    ED Discharge Orders          Ordered    oxyCODONE -acetaminophen  (PERCOCET/ROXICET) 5-325 MG tablet  Every 8 hours PRN        09/04/24 1608    ondansetron  (ZOFRAN ) 4 MG tablet  Every 8 hours PRN        09/04/24 1608               Pamella Ozell LABOR, DO 09/04/24 1610    Pamella Ozell A, DO 09/04/24 1615

## 2024-09-04 NOTE — Discharge Instructions (Addendum)
 You were seen in the emergency department for abdominal pain Your CAT scan showed gallstones but no evidence of gallbladder infection It also showed a fibroid uterus Your symptoms may be due to the gallstones For this reason you should follow-up with surgery in the office to discuss your symptoms and potential removal of your gallbladder (cholecystectomy) on a nonemergent basis We have called in a prescription for Percocet for you to take for severe pain only Do not drink alcohol or drive with taking Percocet Take Zofran  as directed for nausea and vomiting Return to the emerged permit for severe pain if you are unable to eat or drink or have any other concerns

## 2024-09-04 NOTE — ED Notes (Addendum)
Patient denies urge to void at this time.

## 2024-09-04 NOTE — ED Triage Notes (Signed)
 Pt reports right sided pain since Thanksgiving. Pain starts at RLQ abd and radiates to back. Reports she took some laxatives. Pain worse in morning and if she lays down too long.

## 2024-09-04 NOTE — ED Notes (Signed)
Unable to provide urine

## 2024-09-07 ENCOUNTER — Encounter (HOSPITAL_COMMUNITY): Payer: Self-pay | Admitting: General Surgery

## 2024-09-07 ENCOUNTER — Other Ambulatory Visit: Payer: Self-pay

## 2024-09-07 ENCOUNTER — Ambulatory Visit: Payer: Self-pay | Admitting: General Surgery

## 2024-09-07 NOTE — Progress Notes (Addendum)
 For Anesthesia: PCP - Roselie Bishop Mood, NP last office visit note 08/10/24 in [facility] Cardiologist - Raford Riggs, MD not seen since 2017  Bowel Prep reminder: N/A  Chest x-ray - 09/04/24 in [facility] EKG - greater than 1 year Stress Test - 10/05/2015 in [facility] ECHO - 08/14/15  in [facility] Cardiac Cath - N/A Pacemaker/ICD device last checked: N/A Pacemaker orders received: N/A Device Rep notified: N/A  Spinal Cord Stimulator: N/A  Sleep Study - Yes CPAP - N/A  Fasting Blood Sugar -  Checks Blood Sugar _____ times a day Date and result of last Hgb A1c- 08/10/24 (5.9) in [facility]   Last dose of GLP1 agonist- N/A GLP1 instructions: Hold 7 days prior to schedule (Hold 24 hours-daily)   Last dose of SGLT-2 inhibitors- N/A SGLT-2 instructions: Hold 72 hours prior to surgery  Blood Thinner Instructions: N/A Last Dose: N/A Time last taken:   Aspirin Instructions: Last Dose:08/29/2024 Time last taken: 08 AM  Activity level: Can go up a flight of stairs and activities of daily living without stopping and without chest pain and/or shortness of breath    Anesthesia review: N/A  Patient denies shortness of breath, fever, cough and chest pain at PAT appointment   Patient verbalized understanding of instructions that were reviewed over the telephone.

## 2024-09-08 ENCOUNTER — Ambulatory Visit (HOSPITAL_COMMUNITY): Admitting: Anesthesiology

## 2024-09-08 ENCOUNTER — Ambulatory Visit (HOSPITAL_COMMUNITY)
Admission: RE | Admit: 2024-09-08 | Discharge: 2024-09-08 | Disposition: A | Discharge status: Home / Self Care | Attending: General Surgery | Admitting: General Surgery

## 2024-09-08 ENCOUNTER — Encounter (HOSPITAL_COMMUNITY)
Admission: RE | Disposition: A | Discharge status: Home / Self Care | Payer: Self-pay | Source: Home / Self Care | Attending: General Surgery

## 2024-09-08 ENCOUNTER — Other Ambulatory Visit (HOSPITAL_COMMUNITY): Payer: Self-pay

## 2024-09-08 ENCOUNTER — Encounter (HOSPITAL_COMMUNITY): Payer: Self-pay | Admitting: General Surgery

## 2024-09-08 DIAGNOSIS — E119 Type 2 diabetes mellitus without complications: Secondary | ICD-10-CM | POA: Diagnosis not present

## 2024-09-08 DIAGNOSIS — K8010 Calculus of gallbladder with chronic cholecystitis without obstruction: Secondary | ICD-10-CM | POA: Diagnosis present

## 2024-09-08 DIAGNOSIS — I10 Essential (primary) hypertension: Secondary | ICD-10-CM

## 2024-09-08 DIAGNOSIS — K8050 Calculus of bile duct without cholangitis or cholecystitis without obstruction: Secondary | ICD-10-CM

## 2024-09-08 DIAGNOSIS — E785 Hyperlipidemia, unspecified: Secondary | ICD-10-CM | POA: Diagnosis not present

## 2024-09-08 DIAGNOSIS — M1990 Unspecified osteoarthritis, unspecified site: Secondary | ICD-10-CM | POA: Diagnosis not present

## 2024-09-08 DIAGNOSIS — E1169 Type 2 diabetes mellitus with other specified complication: Secondary | ICD-10-CM

## 2024-09-08 HISTORY — PX: CHOLECYSTECTOMY: SHX55

## 2024-09-08 LAB — GLUCOSE, CAPILLARY: Glucose-Capillary: 155 mg/dL — ABNORMAL HIGH (ref 70–99)

## 2024-09-08 SURGERY — LAPAROSCOPIC CHOLECYSTECTOMY
Anesthesia: General

## 2024-09-08 MED ORDER — CHLORHEXIDINE GLUCONATE 0.12 % MT SOLN
15.0000 mL | Freq: Once | OROMUCOSAL | Status: AC
  Administered 2024-09-08: 15 mL via OROMUCOSAL

## 2024-09-08 MED ORDER — HYDROMORPHONE HCL 2 MG/ML IJ SOLN
INTRAMUSCULAR | Status: AC
  Filled 2024-09-08: qty 1

## 2024-09-08 MED ORDER — ONDANSETRON HCL 4 MG/2ML IJ SOLN
INTRAMUSCULAR | Status: DC | PRN
  Administered 2024-09-08: 4 mg via INTRAVENOUS

## 2024-09-08 MED ORDER — OXYCODONE HCL 5 MG PO TABS
ORAL_TABLET | ORAL | Status: AC
  Filled 2024-09-08: qty 1

## 2024-09-08 MED ORDER — CEFAZOLIN SODIUM-DEXTROSE 2-4 GM/100ML-% IV SOLN
2.0000 g | INTRAVENOUS | Status: AC
  Administered 2024-09-08: 2 g via INTRAVENOUS
  Filled 2024-09-08: qty 100

## 2024-09-08 MED ORDER — CHLORHEXIDINE GLUCONATE CLOTH 2 % EX PADS: 6.0000 | MEDICATED_PAD | Freq: Once | CUTANEOUS | Status: DC

## 2024-09-08 MED ORDER — SUCCINYLCHOLINE CHLORIDE 200 MG/10ML IV SOSY
PREFILLED_SYRINGE | INTRAVENOUS | Status: DC | PRN
  Administered 2024-09-08: 120 mg via INTRAVENOUS

## 2024-09-08 MED ORDER — LIDOCAINE HCL (PF) 2 % IJ SOLN
INTRAMUSCULAR | Status: AC
  Filled 2024-09-08: qty 5

## 2024-09-08 MED ORDER — OXYCODONE HCL 5 MG/5ML PO SOLN: 5.0000 mg | Freq: Once | ORAL | Status: DC | PRN

## 2024-09-08 MED ORDER — SUGAMMADEX SODIUM 200 MG/2ML IV SOLN
INTRAVENOUS | Status: DC | PRN
  Administered 2024-09-08: 180 mg via INTRAVENOUS

## 2024-09-08 MED ORDER — KETOROLAC TROMETHAMINE 30 MG/ML IJ SOLN
INTRAMUSCULAR | Status: DC | PRN
  Administered 2024-09-08: 30 mg via INTRAVENOUS

## 2024-09-08 MED ORDER — LACTATED RINGERS IV SOLN: INTRAVENOUS | Status: DC | PRN

## 2024-09-08 MED ORDER — LACTATED RINGERS IR SOLN
Status: DC | PRN
  Administered 2024-09-08: 1000 mL

## 2024-09-08 MED ORDER — AMISULPRIDE (ANTIEMETIC) 5 MG/2ML IV SOLN: 10.0000 mg | Freq: Once | INTRAVENOUS | Status: DC | PRN

## 2024-09-08 MED ORDER — MIDAZOLAM HCL 2 MG/2ML IJ SOLN
INTRAMUSCULAR | Status: AC
  Filled 2024-09-08: qty 4

## 2024-09-08 MED ORDER — FENTANYL CITRATE (PF) 100 MCG/2ML IJ SOLN
INTRAMUSCULAR | Status: DC | PRN
  Administered 2024-09-08: 50 ug via INTRAVENOUS
  Administered 2024-09-08: 100 ug via INTRAVENOUS
  Administered 2024-09-08 (×2): 25 ug via INTRAVENOUS

## 2024-09-08 MED ORDER — OXYCODONE HCL 5 MG PO TABS
5.0000 mg | ORAL_TABLET | Freq: Four times a day (QID) | ORAL | 0 refills | Status: AC | PRN
  Filled 2024-09-08: qty 15, 2d supply, fill #0

## 2024-09-08 MED ORDER — HYDROMORPHONE HCL 1 MG/ML IJ SOLN
INTRAMUSCULAR | Status: AC
  Filled 2024-09-08: qty 1

## 2024-09-08 MED ORDER — SPY AGENT GREEN - (INDOCYANINE FOR INJECTION)
1.2500 mg | Freq: Once | INTRAMUSCULAR | Status: AC
  Administered 2024-09-08: 1.25 mg via INTRAVENOUS

## 2024-09-08 MED ORDER — LACTATED RINGERS IV SOLN: INTRAVENOUS | Status: DC

## 2024-09-08 MED ORDER — KETOROLAC TROMETHAMINE 30 MG/ML IJ SOLN
INTRAMUSCULAR | Status: AC
  Filled 2024-09-08: qty 1

## 2024-09-08 MED ORDER — ALBUTEROL SULFATE HFA 108 (90 BASE) MCG/ACT IN AERS
INHALATION_SPRAY | RESPIRATORY_TRACT | Status: AC
  Filled 2024-09-08: qty 6.7

## 2024-09-08 MED ORDER — ACETAMINOPHEN 500 MG PO TABS
1000.0000 mg | ORAL_TABLET | ORAL | Status: AC
  Administered 2024-09-08: 1000 mg via ORAL
  Filled 2024-09-08: qty 2

## 2024-09-08 MED ORDER — HYDRALAZINE HCL 20 MG/ML IJ SOLN
10.0000 mg | Freq: Four times a day (QID) | INTRAMUSCULAR | Status: DC | PRN
  Administered 2024-09-08: 10 mg via INTRAVENOUS

## 2024-09-08 MED ORDER — DEXAMETHASONE SOD PHOSPHATE PF 10 MG/ML IJ SOLN
INTRAMUSCULAR | Status: DC | PRN
  Administered 2024-09-08: 4 mg via INTRAVENOUS

## 2024-09-08 MED ORDER — MEPERIDINE HCL 25 MG/ML IJ SOLN: 6.2500 mg | INTRAMUSCULAR | Status: DC | PRN

## 2024-09-08 MED ORDER — PROPOFOL 10 MG/ML IV BOLUS
INTRAVENOUS | Status: AC
  Filled 2024-09-08: qty 20

## 2024-09-08 MED ORDER — ROCURONIUM BROMIDE 10 MG/ML (PF) SYRINGE
PREFILLED_SYRINGE | INTRAVENOUS | Status: DC | PRN
  Administered 2024-09-08: 40 mg via INTRAVENOUS

## 2024-09-08 MED ORDER — OXYCODONE HCL 5 MG PO TABS: 5.0000 mg | ORAL_TABLET | Freq: Once | ORAL | Status: DC | PRN

## 2024-09-08 MED ORDER — HYDRALAZINE HCL 20 MG/ML IJ SOLN
INTRAMUSCULAR | Status: AC
  Filled 2024-09-08: qty 1

## 2024-09-08 MED ORDER — SUGAMMADEX SODIUM 200 MG/2ML IV SOLN
INTRAVENOUS | Status: AC
  Filled 2024-09-08: qty 4

## 2024-09-08 MED ORDER — BUPIVACAINE-EPINEPHRINE (PF) 0.25% -1:200000 IJ SOLN
INTRAMUSCULAR | Status: AC
  Filled 2024-09-08: qty 30

## 2024-09-08 MED ORDER — ONDANSETRON HCL 4 MG/2ML IJ SOLN
INTRAMUSCULAR | Status: AC
  Filled 2024-09-08: qty 2

## 2024-09-08 MED ORDER — 0.9 % SODIUM CHLORIDE (POUR BTL) OPTIME
TOPICAL | Status: DC | PRN
  Administered 2024-09-08: 1000 mL

## 2024-09-08 MED ORDER — ORAL CARE MOUTH RINSE: 15.0000 mL | Freq: Once | OROMUCOSAL | Status: AC

## 2024-09-08 MED ORDER — LIDOCAINE HCL (PF) 2 % IJ SOLN
INTRAMUSCULAR | Status: DC | PRN
  Administered 2024-09-08: 100 mg via INTRADERMAL

## 2024-09-08 MED ORDER — FENTANYL CITRATE (PF) 100 MCG/2ML IJ SOLN
INTRAMUSCULAR | Status: AC
  Filled 2024-09-08: qty 2

## 2024-09-08 MED ORDER — HYDROMORPHONE HCL 1 MG/ML IJ SOLN
0.2500 mg | INTRAMUSCULAR | Status: DC | PRN
  Administered 2024-09-08 (×3): 0.5 mg via INTRAVENOUS

## 2024-09-08 MED ORDER — BUPIVACAINE-EPINEPHRINE 0.25% -1:200000 IJ SOLN
INTRAMUSCULAR | Status: DC | PRN
  Administered 2024-09-08: 30 mL

## 2024-09-08 MED ORDER — ROCURONIUM BROMIDE 10 MG/ML (PF) SYRINGE
PREFILLED_SYRINGE | INTRAVENOUS | Status: AC
  Filled 2024-09-08: qty 10

## 2024-09-08 MED ORDER — HYDROMORPHONE HCL 1 MG/ML IJ SOLN
INTRAMUSCULAR | Status: DC | PRN
  Administered 2024-09-08 (×2): .2 mg via INTRAVENOUS

## 2024-09-08 MED ORDER — PROPOFOL 10 MG/ML IV BOLUS
INTRAVENOUS | Status: DC | PRN
  Administered 2024-09-08: 50 mg via INTRAVENOUS
  Administered 2024-09-08: 150 mg via INTRAVENOUS

## 2024-09-08 SURGICAL SUPPLY — 29 items
BAG COUNTER SPONGE SURGICOUNT (BAG) IMPLANT
CHLORAPREP W/TINT 26 (MISCELLANEOUS) ×1 IMPLANT
CLIP APPLIE 5 13 M/L LIGAMAX5 (MISCELLANEOUS) ×1 IMPLANT
COVER MAYO STAND STRL (DRAPES) IMPLANT
DERMABOND ADVANCED .7 DNX12 (GAUZE/BANDAGES/DRESSINGS) ×1 IMPLANT
DRAPE C-ARM 42X120 X-RAY (DRAPES) IMPLANT
ELECT REM PT RETURN 15FT ADLT (MISCELLANEOUS) ×1 IMPLANT
ENDOLOOP SUT PDS II 0 18 (SUTURE) IMPLANT
GLOVE BIO SURGEON STRL SZ 6.5 (GLOVE) ×1 IMPLANT
GLOVE INDICATOR 6.5 STRL GRN (GLOVE) ×2 IMPLANT
GOWN STRL REUS W/ TWL XL LVL3 (GOWN DISPOSABLE) ×3 IMPLANT
IRRIGATION SUCT STRKRFLW 2 WTP (MISCELLANEOUS) ×1 IMPLANT
IV CATH 14GX2 1/4 (CATHETERS) IMPLANT
KIT BASIN OR (CUSTOM PROCEDURE TRAY) ×1 IMPLANT
KIT IMAGING PINPOINTPAQ (MISCELLANEOUS) IMPLANT
KIT TURNOVER KIT A (KITS) ×1 IMPLANT
PENCIL SMOKE EVACUATOR (MISCELLANEOUS) IMPLANT
SCISSORS LAP 5X35 DISP (ENDOMECHANICALS) ×1 IMPLANT
SET CHOLANGIOGRAPH MIX (MISCELLANEOUS) IMPLANT
SET TUBE SMOKE EVAC HIGH FLOW (TUBING) ×1 IMPLANT
SLEEVE ADV FIXATION 5X100MM (TROCAR) ×2 IMPLANT
SUT VIC AB 2-0 SH 27X BRD (SUTURE) ×1 IMPLANT
SUT VIC AB 4-0 PS2 18 (SUTURE) ×1 IMPLANT
SUT VICRYL 0 UR6 27IN ABS (SUTURE) ×1 IMPLANT
SYSTEM BAG RETRIEVAL 10MM (BASKET) ×1 IMPLANT
TOWEL OR DSP ST BLU DLX 10/PK (DISPOSABLE) ×1 IMPLANT
TRAY LAPAROSCOPIC (CUSTOM PROCEDURE TRAY) ×1 IMPLANT
TROCAR ADV FIXATION 5X100MM (TROCAR) ×1 IMPLANT
TROCAR BALLN 12MMX100 BLUNT (TROCAR) IMPLANT

## 2024-09-08 NOTE — H&P (Signed)
 Chief Complaint  Patient presents with   New Consultation      Subjective  She had had 2 weeks of abdominal pain. Worse over the right side. It has been constant. She was in the ED a few days ago with the pain and diagnosed with gallstones. Her pain is better treated with oxycodone  but still present and she is taking medication 3 times a day.   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 Tolerated the procedure well. We discussed icing regimen and home exercises. We discussed which activities to do in which ones to avoid. Patient will try topical anti-inflammatories. Once weekly vitamin D  given for muscle strength and endurance. Follow-up again in 4 weeks. Worsening symptoms consider physical therapy.   Umbilical hernia    UTI (urinary tract infection) 12/25/2017   Past Surgical History:  Procedure Laterality Date   CESAREAN SECTION     x2   fibroid ablation     SPINAL FUSION     TUBAL LIGATION     Family History  Problem Relation Age of Onset   Diabetes Mother    Lung cancer Mother    Pancreatic cancer Sister    Other Brother        aids   Colon cancer Neg Hx    Colon polyps Neg Hx     Social Determinants of Health with Concerns  Social Drivers of Health with Concerns        Tobacco Use: High Risk (09/07/2024)    Patient History     Smoking Tobacco Use: Every Day     Smokeless Tobacco Use:  Never  Financial Resource Strain: Medium Risk (08/10/2024)    Received from [facility]    Overall Financial Resource Strain (CARDIA)     How hard is it for you to pay for the very basics like food, housing, medical care, and heating?: Somewhat hard  Housing Stability: Unknown (09/07/2024)    Housing Stability Vital Sign     Homeless in the Last Year: No      Allergies[1]             Outpatient Medications Prior to Visit  Medication Sig Dispense Refill   albuterol  MDI, PROVENTIL , VENTOLIN , PROAIR , HFA 90 mcg/actuation inhaler Inhale 1-2 inhalations into the lungs every 6 (six) hours as needed for Wheezing or Shortness of Breath       aspirin 81 MG EC tablet Take 81 mg by mouth once daily       losartan -hydroCHLOROthiazide  (HYZAAR) 100-12.5 mg tablet Take 1 tablet by mouth once daily       meloxicam  (MOBIC ) 15 MG tablet Take 15 mg by mouth once daily as needed for Pain       ondansetron  (ZOFRAN ) 4 MG tablet Take 4 mg by mouth       oxyCODONE -acetaminophen  (PERCOCET) 5-325 mg tablet TAKE 1 TABLET  BY MOUTH EVERY 8 HOURS FOR UP TO 3 DAYS AS NEEDED FOR SEVERE PAIN        No facility-administered medications prior to visit.         ROS: as stated above   Objective Vitals:   09/08/24 0722  BP: (!) 179/98  Pulse: 74  Resp: 16  Temp: 99.1 F (37.3 C)  SpO2: 100%    Body mass index is 36.76 kg/m. Physical Exam Constitutional:      Appearance: Normal appearance.  HENT:     Head: Normocephalic and atraumatic.  Pulmonary:     Effort: Pulmonary effort is normal.  Abdominal:     Comments: Tender to palpation epigastrium and right subcostal area with guarding. Infraumbilical scar  Musculoskeletal:        General: Normal range of motion.     Cervical back: Normal range of motion.  Neurological:     General: No focal deficit present.     Mental Status: She is alert and oriented to person, place, and time. Mental status is at baseline.  Psychiatric:        Mood and Affect: Mood  normal.        Behavior: Behavior normal.        Thought Content: Thought content normal.     I reviewed CT images showing multiple large gallstones. I reviewed labs which were normal Sunday.   Assessment/Plan:  Diagnoses and all orders for this visit:   Calculus of gallbladder with acute cholecystitis without obstruction     We discussed the etiology of gallstones and probably cause pain.  We discussed exacerbating factors including fatty meals.  We discussed the details of surgery for removal of the gallbladder including general anesthesia, 4 small incisions in the patient's abdomen, removal of the patient's gallbladder with the liver and common bile duct, and most likely outpatient procedure.  We discussed risks of common bile duct injury, cystic duct stump leak, injury to liver, bleeding, infection, need for open procedure, and post cholecystectomy syndrome.  The patient showed good understanding and wanted to proceed with laparoscopic cholecystectomy.   Bernarda JAYSON Ned, MD  Colorectal and General Surgery [facility]     [1]  Allergies Allergen Reactions   Lisinopril  Cough

## 2024-09-08 NOTE — Transfer of Care (Signed)
 Immediate Anesthesia Transfer of Care Note  Patient: Jill Shah  Procedure(s) Performed: LAPAROSCOPIC CHOLECYSTECTOMY  Patient Location: PACU  Anesthesia Type:General  Level of Consciousness: awake, alert , and oriented  Airway & Oxygen Therapy: Patient Spontanous Breathing and Patient connected to face mask oxygen  Post-op Assessment: Report given to RN and Post -op Vital signs reviewed and stable  Post vital signs: Reviewed and stable  Last Vitals:  Vitals Value Taken Time  BP 183/90 09/08/24 10:30  Temp    Pulse 83 09/08/24 10:35  Resp 15 09/08/24 10:35  SpO2 88 % 09/08/24 10:35  Vitals shown include unfiled device data.  Last Pain:  Vitals:   09/08/24 0759  TempSrc:   PainSc: 0-No pain      Patients Stated Pain Goal: 3 (09/07/24 1440)  Complications: No notable events documented.

## 2024-09-08 NOTE — Op Note (Signed)
 09/08/2024  10:11 AM  PATIENT:  Jill Shah  63 y.o. female  Patient Care Team: Nche, Roselie Rockford, NP as PCP - General (Internal Medicine) Trudy Vaughan HERO, MD as Referring Physician (Specialist)  PRE-OPERATIVE DIAGNOSIS:  ACUTE CALCULOUS CHOLECYSTITIS  POST-OPERATIVE DIAGNOSIS:  BILIARY COLIC  PROCEDURE:  LAPAROSCOPIC CHOLECYSTECTOMY    Surgeon(s): Debby Hila, MD  ASSISTANT: none   ANESTHESIA:   local and general  EBL:  No intake/output data recorded.  DRAINS: none   SPECIMEN:  Source of Specimen:  gallbladder  DISPOSITION OF SPECIMEN:  PATHOLOGY  COUNTS:  YES  PLAN OF CARE: Discharge to home after PACU  PATIENT DISPOSITION:  PACU - hemodynamically stable.  INDICATION: 63 y.o. F with ongoing RUQ pain and gallstones seen on CT  The anatomy & physiology of hepatobiliary & pancreatic function was discussed.  The pathophysiology of gallbladder dysfunction was discussed.  Natural history risks without surgery was discussed.   I feel the risks of no intervention will lead to serious problems that outweigh the operative risks; therefore, I recommended cholecystectomy to remove the pathology.  I explained laparoscopic techniques with possible need for an open approach.  Probable cholangiogram to evaluate the bilary tract was explained as well.    Risks such as bleeding, infection, abscess, leak, injury to other organs, need for further treatment, heart attack, death, and other risks were discussed.  I noted a good likelihood this will help address the problem.  Possibility that this will not correct all abdominal symptoms was explained.  Goals of post-operative recovery were discussed as well.    OR FINDINGS: normal appearing gallbladder  DESCRIPTION:   The patient was identified & brought into the operating room. The patient was positioned supine with arms tucked. SCDs were active during the entire case. The patient underwent general anesthesia without any  difficulty.  The abdomen was prepped and draped in a sterile fashion. A Surgical Timeout was performed and confirmed our plan.  We positioned the patient in reverse [place] & right side up.  I placed a Hassan laparoscopic port through the umbilicus using open entry technique.  Entry was clean. There were no adhesions to the anterior abdominal wall infraumbilically through her previous scar.  We induced carbon dioxide insufflation. Camera inspection revealed no injury. There were some omental adhesions adherent to the abdominal wall.  These were swept down bluntly and the port was replaced.   I proceeded to continue with laparoscopic technique. I placed a 5 mm port in mid subcostal region, another 5mm port in the right flank near the anterior axillary line, and a 5mm port in the left subxiphoid region obliquely within the falciform ligament.  I turned attention to the right upper quadrant.  The gallbladder fundus was elevated cephalad. I used cautery and blunt dissection to free the peritoneal coverings between the gallbladder and the liver on the posteriolateral and anteriomedial walls.   I used careful blunt and cautery dissection with a maryland  dissector to help get a good critical view of the cystic artery and cystic duct. I did further dissection to free a few centimeters of the  gallbladder off the liver bed to get a good critical view of the infundibulum and cystic duct. I mobilized the cystic artery.  I skeletonized the cystic duct.  After getting a good 360 view, I decided not to perform a cholangiogram.  I placed a clip on the infundibulum.  I checked the location of all the biliary structures using the firefly.  I placed clips on the cystic duct x3.  I completed cystic duct transection.   I placed clips on the cystic artery x3 with 2 proximally.  I ligated the cystic artery using scissors. I freed the gallbladder from its remaining attachments to the liver. I ensured hemostasis on the  gallbladder fossa of the liver and elsewhere. I inspected the rest of the abdomen & detected no injury nor bleeding elsewhere. I irrigated the RUQ with normal saline.  I removed the gallbladder through the umbilical port site.  I closed the umbilical fascia using 2, 0 Vicryl stitches using a laparoscopic suture passer.   I closed the skin using 4-0 vicryl stitch.  Sterile dressings were applied. The patient was extubated & arrived in the PACU in stable condition.  I had discussed postoperative care with the patient in the holding area.  I will discuss  operative findings and postoperative goals / instructions with the patient's family.  Instructions are written in the chart as well.   Bernarda JAYSON Ned, MD  Colorectal and General Surgery [facility]

## 2024-09-08 NOTE — Anesthesia Postprocedure Evaluation (Signed)
 Anesthesia Post Note  Patient: Jill Shah  Procedure(s) Performed: LAPAROSCOPIC CHOLECYSTECTOMY     Patient location during evaluation: PACU Anesthesia Type: General Level of consciousness: awake and alert Pain management: pain level controlled Vital Signs Assessment: post-procedure vital signs reviewed and stable Respiratory status: spontaneous breathing, nonlabored ventilation and respiratory function stable Cardiovascular status: blood pressure returned to baseline and stable Postop Assessment: no apparent nausea or vomiting Anesthetic complications: no   No notable events documented.  Last Vitals:  Vitals:   09/08/24 1133 09/08/24 1224  BP: (!) 227/94 (!) 173/84  Pulse:  73  Resp:  20  Temp:  (!) 36.1 C  SpO2:  95%    Last Pain:  Vitals:   09/08/24 1224  TempSrc: Oral  PainSc: 0-No pain                 Butler Levander Pinal

## 2024-09-08 NOTE — Anesthesia Preprocedure Evaluation (Signed)
 Anesthesia Evaluation  Patient identified by MRN, date of birth, ID band Patient awake    Reviewed: Allergy & Precautions, H&P , NPO status , Patient's Chart, lab work & pertinent test results  Airway Mallampati: II  TM Distance: >3 FB Neck ROM: Full    Dental  (+) Dental Advisory Given   Pulmonary neg pulmonary ROS   Pulmonary exam normal breath sounds clear to auscultation       Cardiovascular hypertension, Pt. on medications negative cardio ROS Normal cardiovascular exam Rhythm:Regular Rate:Normal     Neuro/Psych negative neurological ROS  negative psych ROS   GI/Hepatic negative GI ROS, Neg liver ROS,,,  Endo/Other  negative endocrine ROSdiabetes, Type 2    Renal/GU negative Renal ROS  negative genitourinary   Musculoskeletal  (+) Arthritis , Osteoarthritis,    Abdominal  (+) + obese  Peds negative pediatric ROS (+)  Hematology negative hematology ROS (+)   Anesthesia Other Findings   Reproductive/Obstetrics negative OB ROS                              Anesthesia Physical Anesthesia Plan  ASA: 3  Anesthesia Plan: General   Post-op Pain Management: Tylenol  PO (pre-op)*   Induction: Intravenous  PONV Risk Score and Plan: 3 and Ondansetron , Dexamethasone, Midazolam and Treatment may vary due to age or medical condition  Airway Management Planned: Oral ETT  Additional Equipment:   Intra-op Plan:   Post-operative Plan: Extubation in OR  Informed Consent: I have reviewed the patients History and Physical, chart, labs and discussed the procedure including the risks, benefits and alternatives for the proposed anesthesia with the patient or authorized representative who has indicated his/her understanding and acceptance.     Dental advisory given  Plan Discussed with: CRNA  Anesthesia Plan Comments:         Anesthesia Quick Evaluation

## 2024-09-08 NOTE — Anesthesia Procedure Notes (Signed)
 Procedure Name: Intubation Date/Time: 09/08/2024 9:10 AM  Performed by: Obadiah Reyes BROCKS, CRNAPre-anesthesia Checklist: Patient identified, Emergency Drugs available, Suction available and Patient being monitored Patient Re-evaluated:Patient Re-evaluated prior to induction Oxygen Delivery Method: Circle System Utilized Preoxygenation: Pre-oxygenation with 100% oxygen Induction Type: IV induction Ventilation: Mask ventilation without difficulty Tube type: Oral Tube size: 7.0 mm Number of attempts: 1 Airway Equipment and Method: Stylet and Oral airway Placement Confirmation: ETT inserted through vocal cords under direct vision, positive ETCO2 and breath sounds checked- equal and bilateral Secured at: 21 cm Tube secured with: Tape Dental Injury: Teeth and Oropharynx as per pre-operative assessment

## 2024-09-08 NOTE — Discharge Instructions (Addendum)
LAPAROSCOPIC SURGERY: POST OP INSTRUCTIONS  DIET: Follow a light bland diet the first 24 hours after arrival home, such as soup, liquids, crackers, etc.  Be sure to include lots of fluids daily.  Avoid fast food or heavy meals as your are more likely to get nauseated.  Eat a low fat the next few days after surgery.   Take your usually prescribed home medications unless otherwise directed. PAIN CONTROL: Pain is best controlled by a usual combination of three different methods TOGETHER: Ice/Heat Over the counter pain medication Prescription pain medication Most patients will experience some swelling and bruising around the incisions.  Ice packs or heating pads (30-60 minutes up to 6 times a day) will help. Use ice for the first few days to help decrease swelling and bruising, then switch to heat to help relax tight/sore spots and speed recovery.  Some people prefer to use ice alone, heat alone, alternating between ice & heat.  Experiment to what works for you.  Swelling and bruising can take several weeks to resolve.   It is helpful to take an over-the-counter pain medication regularly for the first few weeks.  Choose one of the following that works best for you: Naproxen (Aleve, etc)  Two 220mg tabs twice a day Ibuprofen (Advil, etc) Three 200mg tabs four times a day (every meal & bedtime) A  prescription for pain medication (such as percocet, vicodin, oxycodone, hydrocodone, etc) should be given to you upon discharge.  Take your pain medication as prescribed.  If you are having problems/concerns with the prescription medicine (does not control pain, nausea, vomiting, rash, itching, etc), please call us [phone] to see if we need to switch you to a different pain medicine that will work better for you and/or control your side effect better. If you need a refill on your pain medication, please contact your pharmacy.  They will contact our office to request authorization. Prescriptions will not be  filled after 5 pm or on week-ends.   Avoid getting constipated.  Between the surgery and the pain medications, it is common to experience some constipation.  Increasing fluid intake and taking a fiber supplement (such as Metamucil, Citrucel, FiberCon, MiraLax, etc) 1-2 times a day regularly will usually help prevent this problem from occurring.  A mild laxative (prune juice, Milk of Magnesia, MiraLax, etc) should be taken according to package directions if there are no bowel movements after 48 hours.   Watch out for diarrhea.  If you have many loose bowel movements, simplify your diet to bland foods & liquids for a few days.  Stop any stool softeners and decrease your fiber supplement.  Switching to mild anti-diarrheal medications (Kayopectate, Pepto Bismol) can help.  If this worsens or does not improve, please call us. Wash / shower every day.  You may shower over the dressings as they are waterproof.  Continue to shower over incision(s) after the dressing is off. Remove your waterproof bandages 5 days after surgery.  You may leave the incision open to air.  You may replace a dressing/Band-Aid to cover the incision for comfort if you wish.  ACTIVITIES as tolerated:   You may resume regular (light) daily activities beginning the next day--such as daily self-care, walking, climbing stairs--gradually increasing activities as tolerated.  If you can walk 30 minutes without difficulty, it is safe to try more intense activity such as jogging, treadmill, bicycling, low-impact aerobics, swimming, etc. Save the most intensive and strenuous activity for last such as sit-ups, heavy   lifting, contact sports, etc  Refrain from any heavy lifting or straining until you are off narcotics for pain control.   DO NOT PUSH THROUGH PAIN.  Let pain be your guide: If it hurts to do something, don't do it.  Pain is your body warning you to avoid that activity for another week until the pain goes down. You may drive when you are  no longer taking prescription pain medication, you can comfortably wear a seatbelt, and you can safely maneuver your car and apply brakes. You may have sexual intercourse when it is comfortable.  FOLLOW UP in our office Please call [facility] at [phone] to set up an appointment to see your surgeon in the office for a follow-up appointment approximately 2-3 weeks after your surgery. Make sure that you call for this appointment the day you arrive home to insure a convenient appointment time. 10. IF YOU HAVE DISABILITY OR FAMILY LEAVE FORMS, BRING THEM TO THE OFFICE FOR PROCESSING.  DO NOT GIVE THEM TO YOUR DOCTOR.   WHEN TO CALL US [phone]: Poor pain control Reactions / problems with new medications (rash/itching, nausea, etc)  Fever over 101.5 F (38.5 C) Inability to urinate Nausea and/or vomiting Worsening swelling or bruising Continued bleeding from incision. Increased pain, redness, or drainage from the incision   The clinic staff is available to answer your questions during regular business hours (8:30am-5pm).  Please don't hesitate to call and ask to speak to one of our nurses for clinical concerns.   If you have a medical emergency, go to the nearest emergency room or call 911.  A surgeon from [facility] is always on call at the hospitals   [facility] [address], [address], [place], [place]  [address] ? MAIN: [phone] ? TOLL FREE: [phone] ?  FAX [phone] [email or web address]   

## 2024-09-09 ENCOUNTER — Encounter (HOSPITAL_COMMUNITY): Payer: Self-pay | Admitting: General Surgery

## 2024-09-09 ENCOUNTER — Ambulatory Visit: Admitting: Nurse Practitioner

## 2024-09-09 VITALS — BP 120/70 | HR 84 | Temp 99.0°F | Ht 60.0 in | Wt 185.4 lb

## 2024-09-09 DIAGNOSIS — E1169 Type 2 diabetes mellitus with other specified complication: Secondary | ICD-10-CM

## 2024-09-09 DIAGNOSIS — Z9049 Acquired absence of other specified parts of digestive tract: Secondary | ICD-10-CM | POA: Insufficient documentation

## 2024-09-09 DIAGNOSIS — I10 Essential (primary) hypertension: Secondary | ICD-10-CM

## 2024-09-09 LAB — MICROALBUMIN / CREATININE URINE RATIO
Creatinine,U: 250.4 mg/dL
Microalb Creat Ratio: 13.4 mg/g (ref 0.0–30.0)
Microalb, Ur: 3.3 mg/dL — ABNORMAL HIGH (ref 0.0–1.9)

## 2024-09-09 NOTE — Patient Instructions (Signed)
 Go to lab Maintain Heart healthy diet and daily exercise. Maintain current medications.  Mediterranean Diet A Mediterranean diet is based on the traditions of countries on the [employer]. It focuses on eating more: Fruits and vegetables. Whole grains, beans, nuts, and seeds. Heart-healthy fats. These are fats that are good for your heart. It involves eating less: Dairy. Meat and eggs. Processed foods with added sugar, salt, and fat. This type of diet can help prevent certain conditions. It can also improve outcomes if you have a long-term (chronic) disease, such as kidney or heart disease. What are tips for following this plan? Reading food labels Check packaged foods for: The serving size. For foods such as rice and pasta, the serving size is the amount of cooked product, not dry. The total fat. Avoid foods with saturated fat or trans fat. Added sugars, such as corn syrup. Shopping  Try to have a balanced diet. Buy a variety of foods, such as: Fresh fruits and vegetables. You may be able to get these from local farmers markets. You can also buy them frozen. Grains, beans, nuts, and seeds. Some of these can be bought in bulk. Fresh seafood. Poultry and eggs. Low-fat dairy products. Buy whole ingredients instead of foods that have already been packaged. If you can't get fresh seafood, buy precooked frozen shrimp or canned fish, such as tuna, salmon, or sardines. Stock your pantry so you always have certain foods on hand, such as olive oil, canned tuna, canned tomatoes, rice, pasta, and beans. Cooking Cook foods with extra-virgin olive oil instead of using butter or other vegetable oils. Have meat as a side dish. Have vegetables or grains as your main dish. This means having meat in small portions or adding small amounts of meat to foods like pasta or stew. Use beans or vegetables instead of meat in common dishes like chili or lasagna. Try out different cooking methods. Try  roasting, broiling, steaming, and sauting vegetables. Add frozen vegetables to soups, stews, pasta, or rice. Add nuts or seeds for added healthy fats and plant protein at each meal. You can add these to yogurt, salads, or vegetable dishes. Marinate fish or vegetables using olive oil, lemon juice, garlic, and fresh herbs. Meal planning Plan to eat a vegetarian meal one day each week. Try to work up to two vegetarian meals, if possible. Eat seafood two or more times a week. Have healthy snacks on hand. These may include: Vegetable sticks with hummus. Greek yogurt. Fruit and nut trail mix. Eat balanced meals. These should include: Fruit: 2-3 servings a day. Vegetables: 4-5 servings a day. Low-fat dairy: 2 servings a day. Fish, poultry, or lean meat: 1 serving a day. Beans and legumes: 2 or more servings a week. Nuts and seeds: 1-2 servings a day. Whole grains: 6-8 servings a day. Extra-virgin olive oil: 3-4 servings a day. Limit red meat and sweets to just a few servings a month. Lifestyle  Try to cook and eat meals with your family. Drink enough fluid to keep your pee (urine) pale yellow. Be active every day. This includes: Aerobic exercise, which is exercise that causes your heart to beat faster. Examples include running and swimming. Leisure activities like gardening, walking, or housework. Get 7-8 hours of sleep each night. Drink red wine if your provider says you can. A glass of wine is 5 oz (150 mL). You may be allowed to have: Up to 1 glass a day if you're female and not pregnant. Up to 2 glasses  a day if you're female. What foods should I eat? Fruits Apples. Apricots. Avocado. Berries. Bananas. Cherries. Dates. Figs. Grapes. Lemons. Melon. Oranges. Peaches. Plums. Pomegranate. Vegetables Artichokes. Beets. Broccoli. Cabbage. Carrots. Eggplant. Green beans. Chard. Kale. Spinach. Onions. Leeks. Peas. Squash. Tomatoes. Peppers. Radishes. Grains Whole-grain pasta. Brown rice.  Bulgur wheat. Polenta. Couscous. Whole-wheat bread. Orpah Cobb. Meats and other proteins Beans. Almonds. Sunflower seeds. Pine nuts. Peanuts. Cod. Salmon. Scallops. Shrimp. Tuna. Tilapia. Clams. Oysters. Eggs. Chicken or [place] without skin. Dairy Low-fat milk. Cheese. Greek yogurt. Fats and oils Extra-virgin olive oil. Avocado oil. Grapeseed oil. Beverages Water. Red wine. Herbal tea. Sweets and desserts Greek yogurt with honey. Baked apples. Poached pears. Trail mix. Seasonings and condiments Basil. Cilantro. Coriander. Cumin. Mint. Parsley. Sage. Rosemary. Tarragon. Garlic. Oregano. Thyme. Pepper. Balsamic vinegar. Tahini. Hummus. Tomato sauce. Olives. Mushrooms. The items listed above may not be all the foods and drinks you can have. Talk to a dietitian to learn more. What foods should I limit? This is a list of foods that should be eaten rarely. Fruits Fruit canned in syrup. Vegetables Deep-fried potatoes, like [place] fries. Grains Packaged pasta or rice dishes. Cereal with added sugar. Snacks with added sugar. Meats and other proteins Beef. Pork. Lamb. Chicken or [place] with skin. Hot dogs. Tomasa Blase. Dairy Ice cream. Sour cream. Whole milk. Fats and oils Butter. Canola oil. Vegetable oil. Beef fat (tallow). Lard. Beverages Juice. Sugar-sweetened soft drinks. Beer. Liquor and spirits. Sweets and desserts Cookies. Cakes. Pies. Candy. Seasonings and condiments Mayonnaise. Pre-made sauces and marinades. The items listed above may not be all the foods and drinks you should limit. Talk to a dietitian to learn more. Where to find more information American Heart Association (AHA): heart.org This information is not intended to replace advice given to you by your health care provider. Make sure you discuss any questions you have with your health care provider. Document Revised: 12/28/2022 Document Reviewed: 12/28/2022 Elsevier Patient Education  2024 [employer].

## 2024-09-09 NOTE — Assessment & Plan Note (Signed)
 Repeat hgbA1c at 5.9% improved Unable to tolerate metformin  Reports she has made dietary modifications No neuropathy, completed foot exam Advised to schedule DIABETES eye exam  No med needed at this time F/up in 3months

## 2024-09-09 NOTE — Assessment & Plan Note (Signed)
 BP at goal with losartan /hctz BP Readings from Last 3 Encounters:  09/09/24 120/70  09/08/24 (!) 173/84  09/04/24 (!) 168/76    Reviewed labs completed during recent hospitalization. Maintain med dose F/up in 3months

## 2024-09-09 NOTE — Progress Notes (Signed)
 Established Patient Visit  Patient: Jill Shah   DOB: 1960-11-12   63 y.o. Female  MRN: [id number] Visit Date: 09/09/2024  Subjective:    Chief Complaint  Patient presents with   Follow-up    4 week follow up for HTN Recent surgery    HPI HYPERTENSION, BENIGN ESSENTIAL BP at goal with losartan /hctz BP Readings from Last 3 Encounters:  09/09/24 120/70  09/08/24 (!) 173/84  09/04/24 (!) 168/76    Reviewed labs completed during recent hospitalization. Maintain med dose F/up in 3months  Hyperlipidemia associated with type 2 diabetes mellitus (HCC) Elevated LDL, TC and triglyceride. She declined to take a statin Advised about need for mediterranean diet and avoiding alcohol. Advised about her risk for heart disease. Repeat labs in 3months   DM (diabetes mellitus) (HCC) Repeat hgbA1c at 5.9% improved Unable to tolerate metformin  Reports she has made dietary modifications No neuropathy, completed foot exam Advised to schedule DIABETES eye exam  No med needed at this time F/up in 3months  Reviewed medical, surgical, and social history today  Medications: Show/hide medication list[1] Reviewed past medical and social history.   ROS per HPI above  Last metabolic panel Lab Results  Component Value Date   GLUCOSE 104 (H) 09/04/2024   NA 138 09/04/2024   K 3.7 09/04/2024   CL 101 09/04/2024   CO2 28 09/04/2024   BUN 10 09/04/2024   CREATININE 0.92 09/04/2024   GFRNONAA >60 09/04/2024   CALCIUM  9.0 09/04/2024   PROT 8.0 09/04/2024   ALBUMIN 3.8 09/04/2024   BILITOT 0.9 09/04/2024   ALKPHOS 56 09/04/2024   AST 20 09/04/2024   ALT 15 09/04/2024   ANIONGAP 9 09/04/2024   Last lipids Lab Results  Component Value Date   CHOL 283 (H) 08/10/2024   HDL 57.90 08/10/2024   LDLCALC 188 (H) 08/10/2024   LDLDIRECT 198.0 12/10/2022   TRIG 185.0 (H) 08/10/2024   CHOLHDL 5 08/10/2024   Last hemoglobin A1c Lab Results  Component Value Date    HGBA1C 5.9 (A) 08/10/2024        Objective:  BP 120/70 (BP Location: Left Arm, Patient Position: Sitting, Cuff Size: Large)   Pulse 84   Temp 99 F (37.2 C) (Oral)   Ht 5' (1.524 m)   Wt 185 lb 6.4 oz (84.1 kg)   SpO2 97%   BMI 36.21 kg/m      Physical Exam Vitals and nursing note reviewed.  Cardiovascular:     Rate and Rhythm: Normal rate.     Pulses: Normal pulses.  Pulmonary:     Effort: Pulmonary effort is normal.  Neurological:     Mental Status: She is alert and oriented to person, place, and time.     No results found for any visits on 09/09/24.    Assessment & Plan:    Problem List Items Addressed This Visit     DM (diabetes mellitus) (HCC)   Repeat hgbA1c at 5.9% improved Unable to tolerate metformin  Reports she has made dietary modifications No neuropathy, completed foot exam Advised to schedule DIABETES eye exam  No med needed at this time F/up in 3months      Relevant Orders   Urine microalbumin-creatinine with uACR   Hyperlipidemia associated with type 2 diabetes mellitus (HCC)   Elevated LDL, TC and triglyceride. She declined to take a statin Advised about need for mediterranean diet and avoiding alcohol. Advised  about her risk for heart disease. Repeat labs in 3months       HYPERTENSION, BENIGN ESSENTIAL - Primary   BP at goal with losartan /hctz BP Readings from Last 3 Encounters:  09/09/24 120/70  09/08/24 (!) 173/84  09/04/24 (!) 168/76    Reviewed labs completed during recent hospitalization. Maintain med dose F/up in 3months      S/P laparoscopic cholecystectomy   Return in about 3 months (around 12/08/2024) for HTN, DM, hyperlipidemia (fasting).     Roselie Mood, NP      [1]  Outpatient Medications Prior to Visit  Medication Sig   albuterol  (VENTOLIN  HFA) 108 (90 Base) MCG/ACT inhaler Inhale 1-2 puffs into the lungs every 6 (six) hours as needed for wheezing or shortness of breath.   aspirin EC 81 MG tablet Take  81 mg by mouth daily. Swallow whole.   ferrous sulfate 325 (65 FE) MG tablet Take 325 mg by mouth daily with breakfast.   losartan -hydrochlorothiazide  (HYZAAR) 100-12.5 MG tablet Take 1 tablet by mouth daily.   meloxicam  (MOBIC ) 15 MG tablet Take 1 tablet (15 mg total) by mouth daily as needed for pain.   oxyCODONE  (OXY IR/ROXICODONE ) 5 MG immediate release tablet Take 1-2 tablets (5-10 mg total) by mouth every 6 (six) hours as needed for severe pain (pain score 7-10).   No facility-administered medications prior to visit.

## 2024-09-09 NOTE — Assessment & Plan Note (Signed)
 Elevated LDL, TC and triglyceride. She declined to take a statin Advised about need for mediterranean diet and avoiding alcohol. Advised about her risk for heart disease. Repeat labs in 3months

## 2024-09-12 ENCOUNTER — Ambulatory Visit: Payer: Self-pay | Admitting: Nurse Practitioner

## 2024-09-15 LAB — SURGICAL PATHOLOGY

## 2024-11-01 ENCOUNTER — Encounter: Admitting: Nurse Practitioner

## 2025-01-05 ENCOUNTER — Encounter: Admitting: Nurse Practitioner
